# Patient Record
Sex: Female | Born: 1955 | Race: White | Hispanic: No | Marital: Married | Smoking: Never smoker
Health system: Southern US, Community
[De-identification: ages and names within clinical notes are randomized; demographics above are authoritative.]

## PROBLEM LIST (undated history)

## (undated) DIAGNOSIS — T7840XA Allergy, unspecified, initial encounter: Secondary | ICD-10-CM

## (undated) DIAGNOSIS — M810 Age-related osteoporosis without current pathological fracture: Secondary | ICD-10-CM

## (undated) DIAGNOSIS — C55 Malignant neoplasm of uterus, part unspecified: Secondary | ICD-10-CM

## (undated) HISTORY — PX: ABDOMINAL HYSTERECTOMY: SUR658

## (undated) HISTORY — DX: Age-related osteoporosis without current pathological fracture: M81.0

## (undated) HISTORY — DX: Malignant neoplasm of uterus, part unspecified: C55

## (undated) HISTORY — DX: Allergy, unspecified, initial encounter: T78.40XA

---

## 2008-08-06 DIAGNOSIS — C55 Malignant neoplasm of uterus, part unspecified: Secondary | ICD-10-CM

## 2008-08-06 HISTORY — DX: Malignant neoplasm of uterus, part unspecified: C55

## 2013-09-10 ENCOUNTER — Other Ambulatory Visit: Payer: Self-pay | Admitting: Gynecologic Oncology

## 2013-09-10 DIAGNOSIS — C541 Malignant neoplasm of endometrium: Secondary | ICD-10-CM

## 2013-09-11 ENCOUNTER — Telehealth: Payer: Self-pay | Admitting: Oncology

## 2013-09-11 NOTE — Telephone Encounter (Signed)
LEFT MESSAGE FOR PATIENT TO RETURN CALL TO SCHEDULE NEW PATIENT APPT.  °

## 2013-09-11 NOTE — Telephone Encounter (Signed)
C/D 09/11/13 for appt. 09/18/13

## 2013-09-16 ENCOUNTER — Ambulatory Visit: Payer: Self-pay | Admitting: Oncology

## 2013-09-16 ENCOUNTER — Ambulatory Visit: Payer: Self-pay

## 2013-09-16 ENCOUNTER — Other Ambulatory Visit: Payer: Self-pay

## 2013-09-17 ENCOUNTER — Other Ambulatory Visit: Payer: Self-pay | Admitting: Oncology

## 2013-09-18 ENCOUNTER — Telehealth: Payer: Self-pay | Admitting: *Deleted

## 2013-09-18 ENCOUNTER — Ambulatory Visit (HOSPITAL_BASED_OUTPATIENT_CLINIC_OR_DEPARTMENT_OTHER): Payer: BC Managed Care – PPO | Admitting: Oncology

## 2013-09-18 ENCOUNTER — Other Ambulatory Visit: Payer: Self-pay

## 2013-09-18 ENCOUNTER — Other Ambulatory Visit: Payer: BC Managed Care – PPO

## 2013-09-18 ENCOUNTER — Ambulatory Visit: Payer: Self-pay

## 2013-09-18 ENCOUNTER — Encounter: Payer: Self-pay | Admitting: Oncology

## 2013-09-18 VITALS — BP 156/89 | HR 97 | Temp 98.0°F | Resp 20 | Ht 66.0 in | Wt 142.3 lb

## 2013-09-18 DIAGNOSIS — N993 Prolapse of vaginal vault after hysterectomy: Secondary | ICD-10-CM

## 2013-09-18 DIAGNOSIS — C541 Malignant neoplasm of endometrium: Secondary | ICD-10-CM

## 2013-09-18 DIAGNOSIS — C549 Malignant neoplasm of corpus uteri, unspecified: Secondary | ICD-10-CM

## 2013-09-18 DIAGNOSIS — M810 Age-related osteoporosis without current pathological fracture: Secondary | ICD-10-CM

## 2013-09-18 DIAGNOSIS — H9191 Unspecified hearing loss, right ear: Secondary | ICD-10-CM

## 2013-09-18 DIAGNOSIS — D242 Benign neoplasm of left breast: Secondary | ICD-10-CM

## 2013-09-18 NOTE — Progress Notes (Signed)
[facility] NEW PATIENT EVALUATION   Name: Karla Watts Date: 09/18/2013 MRN: [id number] DOB: 01/17/1956  REFERRING PHYSICIAN: Deedra Ehrich CC: Verl Blalock (rheumatology, [facility]), Leda Roys (PCP, [facility]), Suzan Garibaldi (surgeon, [facility]), (J.[facility] McDonald gyn onc [facility])   REASON FOR REFERRAL: changing gyn oncology follow up location to [facility], requests continuing Faslodex in [place].   HISTORY OF PRESENT ILLNESS:Karla Watts is a delightful 58 y.o. female who is seen in consultation at the request of Dr Josephina Shih, as she wishes to continue under his care now in [place] location and is to continue treatment with Faslodex thru this office. Dr Josephina Shih also requests that she be established with high risk breast clinic at [facility]. History is from patient, some recent records from [facility] which I have reviewed and which will be scanned into EMR, and additional records now from patient. Note that her care has been in multiple locations with multiple physicians, and there may be additional pertinent information not presently available to me.   History is of abdominal pain early 2010, with CT March 2010 with 8 cm intrauterine mass consistent with fibroid, and preop endometrial biopsy and PAP normal. 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 That pathology [id number] from 12-03-2008) was reviewed at [facility] 01-31-2009, the [facility] report to be scanned into this EMR: low grade endometrial stromal sarcoma 9.5 cm, confined to uterine corpus, + LVSI, 0/19 nodes involved, benign bilateral ovaries and tubes, benign cervix; "per outside report, the tumor is diffusely and strongly positive for estrogen receptor and progesterone receptor". She had severe hot flashes after surgery/ prior to beginning aromatase inhibitor. She was seen  in consultation by Dr Carlton Adam began [place] 02-2009, which she tolerated poorly due to progressive joint aches, worse hot flashes and related insomnia; she DCd Femara in 12-2009 due to those symptoms. She tried Aromasin from 01-2010 thru 03-2010, also unable to tolerate "due to insomnia". She briefly resumed Femara at 1/2 prior dose. From 04-2012 thru 02-2013 she took raloxifene, prescribed by rheumatologist for low bone density. She has been followed with MRI scans (all at [facility]) and exams by Dr Josephina Shih now every 3 months. Patient developed symptomatic vaginal vault prolapse, with robotically assisted laparoscopic vaginal vault suspension by Dr Suzan Garibaldi, urogynecology at [facility] in [place], on 04-22-2013. At time of surgery, she had "5 mm nodules of recurrent low grade endometrial stromal sarcoma" resected. (Operative note and that path not available in present records, however path was reviewed at [facility], consistent with low grade endometrial stromal sarcoma). She was seen for second opinion consultation at [facility] by Dr Chilton Greathouse on 05-22-13 (consult note copied from patient's records now and will be scanned into EMR). The consultation note suggests that all of this incidentally found recurrence was resected at time of the urogynecology procedure; recommendation was for monthly Faslodex. She had #3 Faslodex at [facility] 09-10-2013.  Faslodex injections have been complicated by pain at injection sites, improved when these have been given higher in buttocks and with use of heating pad afterwards. She had had some fatigue after injections and some nausea without vomiting, improved with prn phenergan. She had diarrhea on day 3 after most recent injection. Hot flashes are negligible now and she is sleeping well.  The urogynecologic procedure has improved bladder symptoms significantly, tho still voids hourly and has slight urinary leakage at hs. This is  tolerable for her, and she states that Dr  Kristine Linea is aware.   Oncologic history is also remarkable for excisional biopsy of intraductal papilloma with atypia from left breast 1998 (path not available now). She has been seen at [facility] high risk breast clinic, tho I am not clear which physician. Most recent mammograms were at [facility] 01-19-2013, bilateral digital diagnostic with ultrasound, with breast tissue noted to be heterogeneously dense with no dominant mass or calcification, no significant change back to 2008, [place] with 4 mm cysts bilaterally (9:00 right and 10:00 left). I have discussed 3D/ tomo mammography with patient, as this has been useful with dense breast tissue; at our imaging facilities, the 3D is done on screening mammograms. She wonders if she should have breast MRI, tho again at our facilities the MRI needs to be within 3 months of mammograms.    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 No change self breast exam. No cardiac symptoms. GERD with lactose and ETOH, lactose intolerant. Bowels moving daily with stool softener. Weight in ~ usual range. No LE swelling, No hx blood clots. No bleeding. Arthritis pain in feet (= reason she follows with rheumatologist).   Remainder of full 10 point review of systems negative.   ALLERGIES: Ciprofloxacin; Morphine and related; Penicillins; and Sulfur Cipro - swelling, sulfa- hives, morphine - N/V, PCN - nausea  PAST MEDICAL/ SURGICAL HISTORY:    "3 or 4" miscarriages, with D&Cs  Low grade endometrial stromal sarcoma 11-2008: TAH, BSO, nodes ([place]). Recurrent <=5 mm nodules resected at repair of vaginal vault prolapse 04-2013.  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 2 other skin cancers from nose 15 years ago.  Osteoarthritis  Deafness left ear since childhood illness  (I did not find out colonoscopy status)  She had flu vaccine this season and has had zoster vaccine  CURRENT MEDICATIONS: reviewed as listed now in EMR  PHARMACY: [place], [place]   SOCIAL HISTORY: Originally from [place], lives in [place] with husband. 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 One daughter age 10, junior at [employer]. Never smoker, never transfused. Works out at gym 2-3x weekly, including biking and swimming, tho had not been very active in years past. Patient's sister is wife of Dr Osborn Coho.   FAMILY HISTORY:  Mother with breast cancer age 40, died age 47 Father died of lymphoma age 20 1 sister healthy 1 sister with high risk for breast cancer, on [place] 1 brother back surgery 1 daughter healthy           PHYSICAL EXAM:  height is 5\' 6"  (1.676 m) and weight is 142 lb 4.8 oz (64.547 kg). Her oral temperature is 98 F (36.7 C). Her blood pressure is 156/89 and her pulse is 97. Her respiration is 20.  Alert, pleasant, cooperative, excellent historian. Easily ambulatory, looks comfortable.  HEENT: PERRL, not icteric. Oral mucosa moist and clear, no obvious dental problems. Neck supple without JVD or thyromegaly.   RESPIRATORY:respirations not labored RA. Clear to auscultation and  percussion anteriorly and laterally. No use of accessory muscles  CARDIAC/ VASCULAR: RRR without murmur or gallop  ABDOMEN: soft, not tender, not distended. Normally active bowel sounds. Midline incision and laparoscopic incisions all well-healed. No appreciable HSM or mass.  LYMPH NODES: no cervical, supraclavicular, axillary or inguinal adenopathy  BREASTS: well healed  scar above left areola. Bilaterally no dominant mass, no skin or nipple findings. Axillae benign. No swelling UE.  NEUROLOGIC: other than decreased hearing right, nonfocal CN, motor, sensory, cerebellar. Psych: mood and affect appropriate.  SKIN:no rash, ecchymosis, petechiae. No concerning skin lesions noted.  MUSCULOSKELETAL: no joint abnormalities, symmetrical muscle mass. Extremities without CCE, cords, tenderness. Back not tender.    LABORATORY DATA:  Labs from [facility] 09-10-2013: WBC 8.3, ANC 4.2, Hgb 15, MCV 87, plt 280k CMET with Na 140, K 4.4, Cl 103, CO2 26, BUN 15, creat 0.65, glu 89, Ca 10, alb 4.7, Tprot 7.6, Tbili 0.4, AST 22, ALT 26, AP 100, Mg 2.2 (report to be scanned)  PATHOLOGY: [facility] review of original pathology as above (to be scanned)  RADIOGRAPHY: MRI AP w/wo contrast from [facility] 06-19-2013, compared with MRI of 10-22-12 no pericardial or pleural effusion, mild fatty infiltration left hepatic lobe with liver otherwise normal, no free fluid or adenopathy, other organs not remarkable, vertebral hemangiomas T10, L2 and L3. (report to be scanned)  Mammograms from [facility] 01-19-2013 as above (report to be scanned)    DISCUSSION: we have reviewed typical side effects with Faslodex; I am not aware of GI side effects progressively worsening with that agent, so hopefully this will not be the case. Patient has had questions about treatment of the endometrial stromal sarcoma answered as best I am able now; she understands that I have not had access to many of her records prior to visit. We have discussed having follow up MRIs done at [facility] vs in [place], and she is glad to do whichever Dr Josephina Shih prefers. She is glad to have Faslodex done at our facility and hopes to have follow up visits with Dr Josephina Shih here also. She is considering having mammograms also done in [place]; I have told her options of [place], and she may want Dr Dickie La input.  She would like to meet with Dr Humphrey Rolls in high risk breast clinic at [facility] (which will probably be best done separately from my visits rather than 2 providers in one day for billing purposes, tho I will try to confirm this). Fortunately Mrs.Vicks is very organized and savvy about her medical care, as multiple providers in multiple locations certainly is challenging. Note she does not follow closely with PCP, does see rheumatologist Dr Alford Highland on some regular basis, may have year follow up with Dr Kristine Linea (?), sees Dr Josephina Shih every 3 months.     IMPRESSION / PLAN:  1.low grade endometrial stromal sarcoma: history as above, incidentally found recurrent at urogynecologic procedure 04-2013, now on monthly Faslodex. She will have Faslodex ~ March 5, then I will see her with dose in April. She will be due to see Dr Josephina Shih with MRI in May. 2.Post repair of vaginal prolapse 04-2013: symptoms improved, tho still urinary frequency and some incontinence 3.history of intraductal papilloma left breast with atypia 1998, and sister with high risk for breast cancer: patient would like to be followed at high risk breast clinic at [facility] if this can be coordinated. 4.osteoporosis: Reclast given 08-2013, on calcium with D and supplemental D3, now doing regular exercise 5.deafness right ear  since childhood 6.hx basal cell skin ca 7.multiple miscarriages 8.osteoarthritis 9. Arthralgias from aromatase inhibitors 10. Multiple other medication allergies/ intolerances as listed 11.flu vaccine done    Patient has had questions answered as best I am able; she understands that I have not had access to many of her records prior to visit . She can contact this office for questions or concerns at any time prior to next scheduled visit. She did sign release of information if additional records needed.  Time spent  55 min, including >50% discussion and coordination of care.  No antiemetics prescribed. No chemotherapy  planned/ # of cycles not applicable. Consent for Faslodex to be obtained prior to administration at [facility].   Gordy Levan, MD 09/18/2013 4:49 PM

## 2013-09-18 NOTE — Telephone Encounter (Signed)
appts made and printed...td 

## 2013-09-18 NOTE — Patient Instructions (Signed)
Call if questions or concerns prior to next scheduled visit     [phone]

## 2013-09-20 ENCOUNTER — Encounter: Payer: Self-pay | Admitting: Oncology

## 2013-09-20 DIAGNOSIS — D242 Benign neoplasm of left breast: Secondary | ICD-10-CM | POA: Insufficient documentation

## 2013-09-20 DIAGNOSIS — C541 Malignant neoplasm of endometrium: Secondary | ICD-10-CM | POA: Insufficient documentation

## 2013-09-20 DIAGNOSIS — N993 Prolapse of vaginal vault after hysterectomy: Secondary | ICD-10-CM | POA: Insufficient documentation

## 2013-09-20 DIAGNOSIS — H9191 Unspecified hearing loss, right ear: Secondary | ICD-10-CM | POA: Insufficient documentation

## 2013-09-20 DIAGNOSIS — M810 Age-related osteoporosis without current pathological fracture: Secondary | ICD-10-CM | POA: Insufficient documentation

## 2013-09-20 NOTE — Progress Notes (Signed)
Medical Oncology  Have requested [facility] to send my note of 09-18-13 to Drs Verl Blalock, Leda Roys and Suzan Garibaldi, as these physicians not presently in routing for this EMR. Requested they be added to routing for future communication. Godfrey Pick, MD

## 2013-09-21 ENCOUNTER — Telehealth: Payer: Self-pay | Admitting: Oncology

## 2013-09-21 NOTE — Telephone Encounter (Signed)
message sent to [place] re pt being seen in high risk BRCL. all other appts requested per 2/15 pof (deferred 2/13) already on schedule. LL also copied on message.

## 2013-10-01 ENCOUNTER — Telehealth: Payer: Self-pay | Admitting: Oncology

## 2013-10-01 NOTE — Telephone Encounter (Signed)
per email response from [place] she can see pt (high risk) in 3-4 months on a tuesday in the AM. LL py - LL requesting high risk appt in [facility] lmonvm for pt re appt for 5/19 w/KK and also confirmed 3/5 and 4/1 appt. schedule mailed.

## 2013-10-07 ENCOUNTER — Other Ambulatory Visit: Payer: Self-pay | Admitting: Physician Assistant

## 2013-10-07 DIAGNOSIS — C541 Malignant neoplasm of endometrium: Secondary | ICD-10-CM

## 2013-10-08 ENCOUNTER — Ambulatory Visit (HOSPITAL_BASED_OUTPATIENT_CLINIC_OR_DEPARTMENT_OTHER): Payer: BC Managed Care – PPO

## 2013-10-08 VITALS — BP 138/88 | HR 92 | Temp 98.4°F

## 2013-10-08 DIAGNOSIS — C541 Malignant neoplasm of endometrium: Secondary | ICD-10-CM

## 2013-10-08 DIAGNOSIS — C549 Malignant neoplasm of corpus uteri, unspecified: Secondary | ICD-10-CM

## 2013-10-08 DIAGNOSIS — Z5111 Encounter for antineoplastic chemotherapy: Secondary | ICD-10-CM

## 2013-10-08 MED ORDER — FULVESTRANT 250 MG/5ML IM SOLN
500.0000 mg | Freq: Once | INTRAMUSCULAR | Status: AC
Start: 2013-10-08 — End: 2013-10-08
  Administered 2013-10-08: 500 mg via INTRAMUSCULAR
  Filled 2013-10-08: qty 10

## 2013-10-08 NOTE — Patient Instructions (Signed)
Fulvestrant injection What is this medicine? FULVESTRANT (ful VES trant) blocks the effects of estrogen. It is used to treat breast cancer in women past the age of menopause. This medicine may be used for other purposes; ask your health care provider or pharmacist if you have questions. COMMON BRAND NAME(S): FASLODEX What should I tell my health care provider before I take this medicine? They need to know if you have any of these conditions: -bleeding problems -liver disease -low levels of platelets in the blood -an unusual or allergic reaction to fulvestrant, other medicines, foods, dyes, or preservatives -pregnant or trying to get pregnant -breast-feeding How should I use this medicine? This medicine is for injection into a muscle. It is usually given by a health care professional in a hospital or clinic setting. Talk to your pediatrician regarding the use of this medicine in children. Special care may be needed. Overdosage: If you think you have taken too much of this medicine contact a poison control center or emergency room at once. NOTE: This medicine is only for you. Do not share this medicine with others. What if I miss a dose? It is important not to miss your dose. Call your doctor or health care professional if you are unable to keep an appointment. What may interact with this medicine? -medicines that treat or prevent blood clots like warfarin, enoxaparin, and dalteparin This list may not describe all possible interactions. Give your health care provider a list of all the medicines, herbs, non-prescription drugs, or dietary supplements you use. Also tell them if you smoke, drink alcohol, or use illegal drugs. Some items may interact with your medicine. What should I watch for while using this medicine? Your condition will be monitored carefully while you are receiving this medicine. You will need important blood work done while you are taking this medicine. Do not become pregnant  while taking this medicine. Women should inform their doctor if they wish to become pregnant or think they might be pregnant. There is a potential for serious side effects to an unborn child. Talk to your health care professional or pharmacist for more information. What side effects may I notice from receiving this medicine? Side effects that you should report to your doctor or health care professional as soon as possible: -allergic reactions like skin rash, itching or hives, swelling of the face, lips, or tongue -feeling faint or lightheaded, falls -fever or flu-like symptoms -sore throat -vaginal bleeding Side effects that usually do not require medical attention (report to your doctor or health care professional if they continue or are bothersome): -aches, pains -constipation or diarrhea -headache -hot flashes -nausea, vomiting -pain at site where injected -stomach pain This list may not describe all possible side effects. Call your doctor for medical advice about side effects. You may report side effects to FDA at 1-800-FDA-1088. Where should I keep my medicine? This drug is given in a hospital or clinic and will not be stored at home. NOTE: This sheet is a summary. It may not cover all possible information. If you have questions about this medicine, talk to your doctor, pharmacist, or health care provider.  2014, Elsevier/Gold Standard. (2007-12-01 15:39:24)  

## 2013-11-01 ENCOUNTER — Other Ambulatory Visit: Payer: Self-pay | Admitting: Oncology

## 2013-11-01 DIAGNOSIS — C541 Malignant neoplasm of endometrium: Secondary | ICD-10-CM

## 2013-11-04 ENCOUNTER — Ambulatory Visit (HOSPITAL_BASED_OUTPATIENT_CLINIC_OR_DEPARTMENT_OTHER): Payer: BC Managed Care – PPO | Admitting: Oncology

## 2013-11-04 ENCOUNTER — Telehealth: Payer: Self-pay | Admitting: Oncology

## 2013-11-04 ENCOUNTER — Ambulatory Visit (HOSPITAL_BASED_OUTPATIENT_CLINIC_OR_DEPARTMENT_OTHER): Payer: BC Managed Care – PPO

## 2013-11-04 ENCOUNTER — Other Ambulatory Visit (HOSPITAL_BASED_OUTPATIENT_CLINIC_OR_DEPARTMENT_OTHER): Payer: BC Managed Care – PPO

## 2013-11-04 ENCOUNTER — Encounter: Payer: Self-pay | Admitting: Oncology

## 2013-11-04 VITALS — BP 147/88 | HR 89 | Temp 98.1°F | Resp 18 | Ht 66.0 in | Wt 141.2 lb

## 2013-11-04 DIAGNOSIS — C541 Malignant neoplasm of endometrium: Secondary | ICD-10-CM

## 2013-11-04 DIAGNOSIS — C549 Malignant neoplasm of corpus uteri, unspecified: Secondary | ICD-10-CM

## 2013-11-04 DIAGNOSIS — Z5111 Encounter for antineoplastic chemotherapy: Secondary | ICD-10-CM

## 2013-11-04 DIAGNOSIS — M810 Age-related osteoporosis without current pathological fracture: Secondary | ICD-10-CM

## 2013-11-04 LAB — CBC WITH DIFFERENTIAL/PLATELET
BASO%: 0.5 % (ref 0.0–2.0)
Basophils Absolute: 0 10*3/uL (ref 0.0–0.1)
EOS%: 2.4 % (ref 0.0–7.0)
Eosinophils Absolute: 0.2 10*3/uL (ref 0.0–0.5)
HCT: 42.6 % (ref 34.8–46.6)
HGB: 14.2 g/dL (ref 11.6–15.9)
LYMPH%: 37 % (ref 14.0–49.7)
MCH: 29.2 pg (ref 25.1–34.0)
MCHC: 33.4 g/dL (ref 31.5–36.0)
MCV: 87.5 fL (ref 79.5–101.0)
MONO#: 0.6 10*3/uL (ref 0.1–0.9)
MONO%: 7 % (ref 0.0–14.0)
NEUT#: 4.7 10*3/uL (ref 1.5–6.5)
NEUT%: 53.1 % (ref 38.4–76.8)
Platelets: 271 10*3/uL (ref 145–400)
RBC: 4.87 10*6/uL (ref 3.70–5.45)
RDW: 13.1 % (ref 11.2–14.5)
WBC: 8.8 10*3/uL (ref 3.9–10.3)
lymph#: 3.3 10*3/uL (ref 0.9–3.3)

## 2013-11-04 LAB — COMPREHENSIVE METABOLIC PANEL (CC13)
ALT: 15 U/L (ref 0–55)
AST: 15 U/L (ref 5–34)
Albumin: 4.4 g/dL (ref 3.5–5.0)
Alkaline Phosphatase: 72 U/L (ref 40–150)
Anion Gap: 12 mEq/L — ABNORMAL HIGH (ref 3–11)
BUN: 17.9 mg/dL (ref 7.0–26.0)
CO2: 26 mEq/L (ref 22–29)
Calcium: 10.4 mg/dL (ref 8.4–10.4)
Chloride: 105 mEq/L (ref 98–109)
Creatinine: 0.8 mg/dL (ref 0.6–1.1)
Glucose: 122 mg/dl (ref 70–140)
Potassium: 4 mEq/L (ref 3.5–5.1)
Sodium: 143 mEq/L (ref 136–145)
Total Bilirubin: 0.47 mg/dL (ref 0.20–1.20)
Total Protein: 7.6 g/dL (ref 6.4–8.3)

## 2013-11-04 MED ORDER — FULVESTRANT 250 MG/5ML IM SOLN
500.0000 mg | INTRAMUSCULAR | Status: DC
  Administered 2013-11-04: 500 mg via INTRAMUSCULAR
  Filled 2013-11-04: qty 10

## 2013-11-04 NOTE — Telephone Encounter (Signed)
, °

## 2013-11-04 NOTE — Progress Notes (Signed)
OFFICE PROGRESS NOTE   11/04/2013   Physicians:Daniel ClarkePearson, Verl Blalock (rheumatology, [place]), Leda Roys (PCP, [facility]), Suzan Garibaldi (surgeon, [place] [place]), (J.Matthew McDonald gyn onc [facility]), [address] (GI, [place])   INTERVAL HISTORY:   Patient is seen, alone for visit, in conjunction with monthly Faslodex injection which is being used at recommendation of Dr Chilton Greathouse at [facility] for recurrent low grade endometrial stromal sarcoma. First 3 Faslodex injections were done at [facility] oncology; patient subsequently requested these be done in [place], and may follow with  Dr Josephina Shih also in [place] office. Last scan was MRI abdomen pelvix at [facility] in  Nov 2014; plan had been for repeat MRI with visit to Dr Josephina Shih in May 2014.  Patient tells me that, since I met her in Feb, she has been seen by APP for Dr Suzan Garibaldi, urogynecologist who did vaginal vault suspension procedure in 04-2013, and was on Vesicare x 10 days. The Vesicare improved voiding symptoms by day 10, but seemed to cause significant abdominal bloating, gas and constipation. She has been back off Vesicare for a week, with improvement in those symptoms, tho still more constipation than baseline and again the problems with urination. She is not comforable resuming Vesicare and agrees that she needs to discuss this with Dr Kristine Linea. She is also scheduled for 5 year follow up colonoscopy nex week, by Dr Cherie Ouch in [place].  Patient is concerned about the bladder symptoms and abdominal symptoms. We need to clarify if Dr Josephina Shih wants repeat scans and visit at [facility] (where she has had prior MRIs and where she may be able to be seen sooner than in [place], as she also has some time in May that she prefers not to have appointments).  She tolerated Faslodex on 10-08-2013 with less local discomfort than previously, still some fatigue, apparently not  significant N/V or diarrhea this time. She feels more emotional when she is due for injection.  ONCOLOGIC HISTORY History is of abdominal pain early 2010, with CT March 2010 with 8 cm intrauterine mass consistent with fibroid, and preop endometrial biopsy and PAP normal. She had TAH BSO and staging 12-03-2008 by gyn oncology at [facility] in [place], with finding of stage IB low grade endometrial stromal sarcoma. That pathology ([id number] from 12-03-2008) was reviewed at [facility] 01-31-2009, the [facility] report to be scanned into this EMR: low grade endometrial stromal sarcoma 9.5 cm, confined to uterine corpus, + LVSI, 0/19 nodes involved, benign bilateral ovaries and tubes, benign cervix; "per outside report, the tumor is diffusely and strongly positive for estrogen receptor and progesterone receptor".  She had severe hot flashes after surgery/ prior to beginning aromatase inhibitor. She was seen in consultation by Dr Carlton Adam began Zena 02-2009, which she tolerated poorly due to progressive joint aches, worse hot flashes and related insomnia; she DCd Femara in 12-2009 due to those symptoms. She tried Aromasin from 01-2010 thru 03-2010, also unable to tolerate "due to insomnia". She briefly resumed Femara at 1/2 prior dose. From 04-2012 thru 02-2013 she took raloxifene, prescribed by rheumatologist for low bone density. She has been followed with MRI scans (all at [facility]) and exams by Dr Josephina Shih now every 3 months.  Patient developed symptomatic vaginal vault prolapse, with robotically assisted laparoscopic vaginal vault suspension by Dr Suzan Garibaldi, urogynecology at [facility] in [place], on 04-22-2013. At time of surgery, she had "5 mm nodules of recurrent low grade endometrial stromal sarcoma" resected. (Operative note and  that path not available in present records, however path was reviewed at [facility], consistent with low grade endometrial stromal sarcoma). She was seen for second  opinion consultation at [facility] by Dr Chilton Greathouse on 05-22-13 (consult note copied from patient's records now and will be scanned into EMR). The consultation note suggests that all of this incidentally found recurrence was resected at time of the urogynecology procedure; recommendation was for monthly Faslodex. She had #3 Faslodex at [facility] 09-10-2013 and injection at [facility] in [place] 10-08-2013.   Review of systems as above, also: No fever or clear symptoms of infection. No bleeding. No LE swelling. No other pain. Remainder of 10 point Review of Systems negative.  Objective:  Vital signs in last 24 hours:  BP 147/88  Pulse 89  Temp(Src) 98.1 F (36.7 C) (Oral)  Resp 18  Ht 5' 6"  (1.676 m)  Wt 141 lb 3.2 oz (64.048 kg)  BMI 22.80 kg/m2 Weight is down 1 lb. Alert, oriented and not in obvious discomfort. Easily mobile and ambulatory without difficulty.  Normal hair pattern.  HEENT:PERRL, sclerae not icteric. Oral mucosa moist without lesions, posterior pharynx clear.  Neck supple. No JVD.  Lymphatics:no cervical,suraclavicular, axillary or inguinal adenopathy Resp: clear to auscultation bilaterally and normal percussion bilaterally Cardio: regular rate and rhythm. No gallop. GI: soft, nontender, not obviously distended, no appreciable mass or organomegaly. Some bowel sounds thruout. Surgical incision not remarkable. Musculoskeletal/ Extremities: without pitting edema, cords, tenderness Neuro: no peripheral neuropathy. Otherwise nonfocal. PSYCH: somewhat anxious Skin without rash, ecchymosis, petechiae   Lab Results:  Results for orders placed in visit on 11/04/13  CBC WITH DIFFERENTIAL      Result Value Ref Range   WBC 8.8  3.9 - 10.3 10e3/uL   NEUT# 4.7  1.5 - 6.5 10e3/uL   HGB 14.2  11.6 - 15.9 g/dL   HCT 42.6  34.8 - 46.6 %   Platelets 271  145 - 400 10e3/uL   MCV 87.5  79.5 - 101.0 fL   MCH 29.2  25.1 - 34.0 pg   MCHC 33.4  31.5 - 36.0 g/dL   RBC 4.87  3.70 - 5.45 10e6/uL    RDW 13.1  11.2 - 14.5 %   lymph# 3.3  0.9 - 3.3 10e3/uL   MONO# 0.6  0.1 - 0.9 10e3/uL   Eosinophils Absolute 0.2  0.0 - 0.5 10e3/uL   Basophils Absolute 0.0  0.0 - 0.1 10e3/uL   NEUT% 53.1  38.4 - 76.8 %   LYMPH% 37.0  14.0 - 49.7 %   MONO% 7.0  0.0 - 14.0 %   EOS% 2.4  0.0 - 7.0 %   BASO% 0.5  0.0 - 2.0 %  COMPREHENSIVE METABOLIC PANEL (DZ32)      Result Value Ref Range   Sodium 143  136 - 145 mEq/L   Potassium 4.0  3.5 - 5.1 mEq/L   Chloride 105  98 - 109 mEq/L   CO2 26  22 - 29 mEq/L   Glucose 122  70 - 140 mg/dl   BUN 17.9  7.0 - 26.0 mg/dL   Creatinine 0.8  0.6 - 1.1 mg/dL   Total Bilirubin 0.47  0.20 - 1.20 mg/dL   Alkaline Phosphatase 72  40 - 150 U/L   AST 15  5 - 34 U/L   ALT 15  0 - 55 U/L   Total Protein 7.6  6.4 - 8.3 g/dL   Albumin 4.4  3.5 - 5.0 g/dL  Calcium 10.4  8.4 - 10.4 mg/dL   Anion Gap 12 (*) 3 - 11 mEq/L     Studies/Results:  No results found.  Medications: I have reviewed the patient's current medications. She is now on mirabegron by urogynecologist She did receive #5 Faslodex today  DISCUSSION: Patient has numerous questions today concerning choice of present Faslodex (recommended by Dr Chilton Greathouse at [facility] after she was unable to tolerate aromatase inhibitors per information that was available to me when I met her last month). She tells me that she spoke back to Dr Dia Crawford office by phone with questions after her visit there, and is aware that she can call that office again or certainly could be seen again by Dr Mauro Kaufmann requested. She has concerns about fact that she was on raloxifene previously (per my information that was by rheumatologist, tho history is complicated and that may not be correct). I have explained mechanism of action of tamoxifen (and raloxifene) vs aromatase inhibitors to her, but have also told her that Dr Mauro Kaufmann has expertise in this problem and certainly it would be best to discuss her concerns about treatment with him.  I do think  the colonoscopy next week is appropriate and have encouraged her to make an appointment with Dr Kristine Linea. I have discussed appoinments with gyn oncology staff in [place], with next available to Dr Josephina Shih here May 20 (given her other time constraints also). I do NOT know if he prefers MRI at [facility] where priors are available, vs [place], and will try to clarify this. Patient feels now that she may want visit and scans at [facility] before May, which seems very reasonable given the voiding and abdominal symptoms recently.  I tried to encourage her to consider continuity of care, as her situation is complicated and it is obviously difficult for multiple physicians in several locations to coordinate her care and follow up.     Assessment/Plan:  1.low grade endometrial stromal sarcoma: history as above, incidentally found recurrent at urogynecologic procedure 04-2013, now on monthly Faslodex since ~ Nov 2014 per recommendation from [facility]. #5 Faslodex given today. Plan was for repeat MRI and reevaluation by Dr Josephina Shih in May, tho some new symptoms so may be reasonable to do this sooner. Will let Dr Josephina Shih know situation to direct scheduling. If she continues Faslodex, this will be due April 29 and May 27. 2.Post repair of vaginal prolapse 04-2013: symptoms improved, but recent increased urinary frequency and some incontinence. Trial of vesicare by urogynecologist in [place] as above 3.history of intraductal papilloma left breast with atypia 1998, and sister with high risk for breast cancer: patient would like to be followed at high risk breast clinic at [facility], set up to see Dr Marcy Panning also in May. 4.osteoporosis: Reclast given 08-2013, on calcium with D and supplemental D3, now doing regular exercise  5.deafness right ear since childhood  6.hx basal cell skin ca  7.multiple miscarriages  8.osteoarthritis  9. Arthralgias from aromatase inhibitors  10. Multiple other medication allergies/  intolerances as listed       Kathren Scearce P, MD   11/04/2013, 3:21 PM

## 2013-11-04 NOTE — Patient Instructions (Signed)
Suggest seeing urology MD about recent bladder symptoms.  Colonoscopy next week as scheduled is appropriate.  Certainly fine to see Dr Josephina Shih at [facility] earlier than the May 20 apt in [place], and could do scans there.

## 2013-11-09 ENCOUNTER — Other Ambulatory Visit: Payer: Self-pay | Admitting: Gynecologic Oncology

## 2013-11-09 ENCOUNTER — Telehealth: Payer: Self-pay | Admitting: *Deleted

## 2013-11-09 DIAGNOSIS — C541 Malignant neoplasm of endometrium: Secondary | ICD-10-CM

## 2013-11-09 NOTE — Progress Notes (Signed)
Patient to be scheduled for MR of the abdomen and pelvis with and without contrast per Dr. Fermin Schwab for recurrent low grade endometrial stromal sarcoma.

## 2013-11-09 NOTE — Telephone Encounter (Signed)
A call was placed [facility] scheduling  to schedule pt's MRI. Karla Watts from [place] scheduling stated that she had called the patient earlier and scheduled the MRI @ [facility] on May 18th.

## 2013-11-10 ENCOUNTER — Telehealth: Payer: Self-pay

## 2013-11-10 NOTE — Telephone Encounter (Signed)
Message copied by Baruch Merl on Tue Nov 10, 2013 11:32 AM ------      Message from: Gordy Levan      Created: Mon Nov 09, 2013  5:23 PM       Labs seen and need follow up: please let her know chemistries from visit last week were fine. Please tell her that I think it would be best to get follow up MRI and to see Dr Josephina Shih before May; if she has not already made these arrangements herself, I will ask gyn onc staff to get things set up. ------

## 2013-11-10 NOTE — Telephone Encounter (Signed)
The lab information as noted below by Dr. Marko Plume was left in patient's voice mail.  Joylene John NP. Has ordered the MRI to be scheduled ~12-21-13.

## 2013-12-01 ENCOUNTER — Ambulatory Visit (HOSPITAL_BASED_OUTPATIENT_CLINIC_OR_DEPARTMENT_OTHER): Payer: BC Managed Care – PPO

## 2013-12-01 VITALS — BP 148/86 | HR 70 | Temp 98.4°F

## 2013-12-01 DIAGNOSIS — C549 Malignant neoplasm of corpus uteri, unspecified: Secondary | ICD-10-CM

## 2013-12-01 DIAGNOSIS — C541 Malignant neoplasm of endometrium: Secondary | ICD-10-CM

## 2013-12-01 DIAGNOSIS — Z5111 Encounter for antineoplastic chemotherapy: Secondary | ICD-10-CM

## 2013-12-01 MED ORDER — FULVESTRANT 250 MG/5ML IM SOLN
500.0000 mg | INTRAMUSCULAR | Status: DC
  Administered 2013-12-01: 500 mg via INTRAMUSCULAR
  Filled 2013-12-01: qty 10

## 2013-12-08 ENCOUNTER — Telehealth: Payer: Self-pay

## 2013-12-08 NOTE — Telephone Encounter (Signed)
Reviewed  Karla Watts' rescheduled appointments for Faslodex injections to keep them 4 weeks apart and coordinate appointments with MD visits as well. Stated that Dr. Humphrey Rolls is currently out on unexpected  FMLA and will not be here to see her on 12-22-13 as currently scheduled. Told her that Breast clinic secretary [name] is rescheduling the high risk patient with Dr. Jana Hakim in August.   Karla Watts stated that she was to see MD for high risk in Feburary and now it will be August.  Can she be referred to someone in [place] or  [place] or some one earlier at [facility]. Told Karla Watts that her appointment concerns will be discussed with Dr. Marko Plume for recommendations at this time.

## 2013-12-15 ENCOUNTER — Other Ambulatory Visit: Payer: Self-pay | Admitting: Oncology

## 2013-12-15 DIAGNOSIS — Z1231 Encounter for screening mammogram for malignant neoplasm of breast: Secondary | ICD-10-CM

## 2013-12-21 ENCOUNTER — Other Ambulatory Visit: Payer: Self-pay | Admitting: Gynecologic Oncology

## 2013-12-21 ENCOUNTER — Ambulatory Visit (HOSPITAL_COMMUNITY)
Admission: RE | Admit: 2013-12-21 | Discharge: 2013-12-21 | Disposition: A | Discharge status: Home / Self Care | Payer: BC Managed Care – PPO | Source: Ambulatory Visit | Attending: Gynecologic Oncology | Admitting: Gynecologic Oncology

## 2013-12-21 DIAGNOSIS — C541 Malignant neoplasm of endometrium: Secondary | ICD-10-CM

## 2013-12-21 DIAGNOSIS — N8111 Cystocele, midline: Secondary | ICD-10-CM | POA: Insufficient documentation

## 2013-12-21 DIAGNOSIS — Z90710 Acquired absence of both cervix and uterus: Secondary | ICD-10-CM | POA: Insufficient documentation

## 2013-12-21 DIAGNOSIS — Z8542 Personal history of malignant neoplasm of other parts of uterus: Secondary | ICD-10-CM | POA: Insufficient documentation

## 2013-12-21 MED ORDER — GADOBENATE DIMEGLUMINE 529 MG/ML IV SOLN
15.0000 mL | Freq: Once | INTRAVENOUS | Status: AC | PRN
  Administered 2013-12-21: 13 mL via INTRAVENOUS

## 2013-12-22 ENCOUNTER — Ambulatory Visit: Payer: BC Managed Care – PPO | Admitting: Oncology

## 2013-12-22 ENCOUNTER — Telehealth: Payer: Self-pay | Admitting: *Deleted

## 2013-12-22 ENCOUNTER — Ambulatory Visit: Payer: BC Managed Care – PPO

## 2013-12-22 NOTE — Telephone Encounter (Signed)
Message copied by Lucile Crater on Tue Dec 22, 2013 10:12 AM ------      Message from: Karla Watts      Created: Tue Dec 22, 2013  8:40 AM       Think she sees Dr CP soon but her MRI looks unremarkable.       ----- Message -----         From: Rad Results In Interface         Sent: 12/21/2013  11:25 AM           To: Dorothyann Gibbs, NP                   ------

## 2013-12-22 NOTE — Telephone Encounter (Signed)
Called pt confirmed appt tomorrow 5/20 with Dr. Fermin Schwab. Results given

## 2013-12-23 ENCOUNTER — Encounter: Payer: Self-pay | Admitting: Gynecology

## 2013-12-23 ENCOUNTER — Ambulatory Visit: Payer: BC Managed Care – PPO | Attending: Gynecology | Admitting: Gynecology

## 2013-12-23 VITALS — BP 142/83 | HR 90 | Temp 97.9°F | Resp 20 | Ht 64.96 in | Wt 144.0 lb

## 2013-12-23 DIAGNOSIS — Z90710 Acquired absence of both cervix and uterus: Secondary | ICD-10-CM | POA: Insufficient documentation

## 2013-12-23 DIAGNOSIS — C549 Malignant neoplasm of corpus uteri, unspecified: Secondary | ICD-10-CM | POA: Insufficient documentation

## 2013-12-23 DIAGNOSIS — C541 Malignant neoplasm of endometrium: Secondary | ICD-10-CM

## 2013-12-23 DIAGNOSIS — Z79899 Other long term (current) drug therapy: Secondary | ICD-10-CM | POA: Insufficient documentation

## 2013-12-23 NOTE — Progress Notes (Signed)
Consult Note: Gyn-Onc   Karla Watts 58 y.o. female  No chief complaint on file.   Assessment : Recurrent low-grade endometrial stromal sarcoma. Clinically free of disease.  Plan: We will continue Faslodex 500 mg IM every 4 weeks. She returned to see me in 6 months and we will obtain an MRI shortly prior to that visit.  Interval History: The patient returns today for continuing followup of low-grade endometrial stromal sarcoma. A recurrence was discovered in September 2014. Subsequently the patient's been on Faslodex 500 mg IM every 4 weeks. On 12/21/2013 she had an MRI of the abdomen and pelvis which is entirely negative. Patient is self sees be doing very well. She denies any GI symptoms. Functional status is excellent. She does complain of some urinary urgency and some swelling in her thighs. Patient's recently seen a urologist to [facility] recommendations regarding changing her diet.  HPI: Patient developed abdominal pain and an 8 cm intrauterine mass in March of 2010. This is thought to be a fibroid. Preoperative endometrial biopsy and Pap smear were normal. She underwent a total abdominal hysterectomy bilateral salpingo-oophorectomy and staging procedures by a gynecologic oncologist. Final pathology showed a low-grade endometrial stromal sarcoma. She received a short course of Femara but did not tolerate it well therefore discontinued therapy. She was then followed until September 2014 with a recurrence was found in the pelvic peritoneum at the time of robotic assisted vaginal vault suspension. The patient sought second opinion consultation at [facility] with the sarcoma service. Multiple options were recommended for adjuvant treatment and subsequently the patient chose be treated with Faslodex.  Review of Systems:10 point review of systems is negative except as noted in interval history.   Vitals: Blood pressure 142/83, pulse 90, temperature 97.9 F (36.6 C),  temperature source Oral, resp. rate 20, height 5' 4.96" (1.65 m), weight 144 lb (65.318 kg).  Physical Exam: General : The patient is a healthy woman in no acute distress.  HEENT: normocephalic, extraoccular movements normal; neck is supple without thyromegally  Lynphnodes: Supraclavicular and inguinal nodes not enlarged  Abdomen: Soft, non-tender, no ascites, no organomegally, no masses, no hernias  Pelvic:  EGBUS: Normal female  Vagina: Normal, no lesions , she does have a moderate cystocele. Urethra and Bladder: Normal, non-tender  Cervix: Surgically absent  Uterus: Surgically absent  Bi-manual examination: Non-tender; no adenxal masses or nodularity  Rectal: normal sphincter tone, no masses, no blood  Lower extremities: No edema or varicosities. Normal range of motion      Allergies  Allergen Reactions  . Ciprofloxacin Swelling  . Morphine And Related Nausea And Vomiting  . Penicillins Nausea Only  . Sulfur Hives    Past Medical History  Diagnosis Date  . Uterine cancer   . Osteoporosis   . Allergy     History reviewed. No pertinent past surgical history.  Current Outpatient Prescriptions  Medication Sig Dispense Refill  . Calcium-Vitamin D-Vitamin K (VIACTIV) [phone] MG-UNT-MCG CHEW Chew 1 tablet by mouth daily.      . cetirizine (ZYRTEC) 10 MG tablet Take 10 mg by mouth daily as needed for allergies.      . Cholecalciferol (VITAMIN D3) 1000 UNITS CAPS Take 1 capsule by mouth daily.      . diclofenac (VOLTAREN) 75 MG EC tablet Take 75 mg by mouth 2 (two) times daily.      Marland Kitchen docusate sodium (COLACE) 100 MG capsule Take 100 mg by mouth 2 (two) times daily.      Marland Kitchen  fluticasone (FLONASE) 50 MCG/ACT nasal spray Place into both nostrils daily as needed for allergies or rhinitis.      . fulvestrant (FASLODEX) 250 MG/5ML injection Inject 500 mg into the muscle every 30 (thirty) days. One injection each buttock over 1-2 minutes. Warm prior to use.      . Mirabegron  (MYRBETRIQ PO) Take by mouth.      . Multiple Vitamin (MULTIVITAMIN) capsule Take 1 capsule by mouth daily.       No current facility-administered medications for this visit.    History   Social History  . Marital Status: Married    Spouse Name: N/A    Number of Children: N/A  . Years of Education: N/A   Occupational History  . Not on file.   Social History Main Topics  . Smoking status: Never Smoker   . Smokeless tobacco: Not on file  . Alcohol Use: No  . Drug Use: No  . Sexual Activity: Not on file   Other Topics Concern  . Not on file   Social History Narrative  . No narrative on file    Family History  Problem Relation Age of Onset  . Cancer Mother   . Cancer Father       Alvino Chapel, MD 12/23/2013, 12:02 PM

## 2013-12-23 NOTE — Patient Instructions (Signed)
Return to see [place] in 6 months. We'll schedule an MRI prior to that visit.

## 2013-12-29 ENCOUNTER — Ambulatory Visit (HOSPITAL_BASED_OUTPATIENT_CLINIC_OR_DEPARTMENT_OTHER): Payer: BC Managed Care – PPO

## 2013-12-29 VITALS — BP 142/99 | HR 87 | Temp 98.2°F

## 2013-12-29 DIAGNOSIS — C541 Malignant neoplasm of endometrium: Secondary | ICD-10-CM

## 2013-12-29 DIAGNOSIS — Z5189 Encounter for other specified aftercare: Secondary | ICD-10-CM

## 2013-12-29 DIAGNOSIS — C549 Malignant neoplasm of corpus uteri, unspecified: Secondary | ICD-10-CM

## 2013-12-29 MED ORDER — FULVESTRANT 250 MG/5ML IM SOLN
500.0000 mg | INTRAMUSCULAR | Status: DC
Start: 2013-12-29 — End: 2013-12-29
  Administered 2013-12-29: 500 mg via INTRAMUSCULAR
  Filled 2013-12-29: qty 10

## 2013-12-29 NOTE — Patient Instructions (Signed)
Fulvestrant injection What is this medicine? FULVESTRANT (ful VES trant) blocks the effects of estrogen. It is used to treat breast cancer in women past the age of menopause. This medicine may be used for other purposes; ask your health care provider or pharmacist if you have questions. COMMON BRAND NAME(S): FASLODEX What should I tell my health care provider before I take this medicine? They need to know if you have any of these conditions: -bleeding problems -liver disease -low levels of platelets in the blood -an unusual or allergic reaction to fulvestrant, other medicines, foods, dyes, or preservatives -pregnant or trying to get pregnant -breast-feeding How should I use this medicine? This medicine is for injection into a muscle. It is usually given by a health care professional in a hospital or clinic setting. Talk to your pediatrician regarding the use of this medicine in children. Special care may be needed. Overdosage: If you think you have taken too much of this medicine contact a poison control center or emergency room at once. NOTE: This medicine is only for you. Do not share this medicine with others. What if I miss a dose? It is important not to miss your dose. Call your doctor or health care professional if you are unable to keep an appointment. What may interact with this medicine? -medicines that treat or prevent blood clots like warfarin, enoxaparin, and dalteparin This list may not describe all possible interactions. Give your health care provider a list of all the medicines, herbs, non-prescription drugs, or dietary supplements you use. Also tell them if you smoke, drink alcohol, or use illegal drugs. Some items may interact with your medicine. What should I watch for while using this medicine? Your condition will be monitored carefully while you are receiving this medicine. You will need important blood work done while you are taking this medicine. Do not become pregnant  while taking this medicine. Women should inform their doctor if they wish to become pregnant or think they might be pregnant. There is a potential for serious side effects to an unborn child. Talk to your health care professional or pharmacist for more information. What side effects may I notice from receiving this medicine? Side effects that you should report to your doctor or health care professional as soon as possible: -allergic reactions like skin rash, itching or hives, swelling of the face, lips, or tongue -feeling faint or lightheaded, falls -fever or flu-like symptoms -sore throat -vaginal bleeding Side effects that usually do not require medical attention (report to your doctor or health care professional if they continue or are bothersome): -aches, pains -constipation or diarrhea -headache -hot flashes -nausea, vomiting -pain at site where injected -stomach pain This list may not describe all possible side effects. Call your doctor for medical advice about side effects. You may report side effects to FDA at 1-800-FDA-1088. Where should I keep my medicine? This drug is given in a hospital or clinic and will not be stored at home. NOTE: This sheet is a summary. It may not cover all possible information. If you have questions about this medicine, talk to your doctor, pharmacist, or health care provider.  2014, Elsevier/Gold Standard. (2007-12-01 15:39:24)  

## 2014-01-18 ENCOUNTER — Ambulatory Visit: Payer: BC Managed Care – PPO

## 2014-01-18 ENCOUNTER — Other Ambulatory Visit: Payer: BC Managed Care – PPO

## 2014-01-18 ENCOUNTER — Ambulatory Visit: Payer: BC Managed Care – PPO | Admitting: Oncology

## 2014-01-24 ENCOUNTER — Other Ambulatory Visit: Payer: Self-pay | Admitting: Oncology

## 2014-01-24 DIAGNOSIS — C541 Malignant neoplasm of endometrium: Secondary | ICD-10-CM

## 2014-01-25 ENCOUNTER — Telehealth: Payer: Self-pay | Admitting: Oncology

## 2014-01-25 NOTE — Telephone Encounter (Signed)
per pof to add inj for pt-cld & spoke to pt & adv we had added inj to 6/24 appt @10 -pt understood

## 2014-01-27 ENCOUNTER — Other Ambulatory Visit (HOSPITAL_BASED_OUTPATIENT_CLINIC_OR_DEPARTMENT_OTHER): Payer: BC Managed Care – PPO

## 2014-01-27 ENCOUNTER — Ambulatory Visit
Admission: RE | Admit: 2014-01-27 | Discharge: 2014-01-27 | Disposition: A | Discharge status: Home / Self Care | Payer: BC Managed Care – PPO | Source: Ambulatory Visit | Attending: Oncology | Admitting: Oncology

## 2014-01-27 ENCOUNTER — Telehealth: Payer: Self-pay | Admitting: Oncology

## 2014-01-27 ENCOUNTER — Ambulatory Visit (HOSPITAL_BASED_OUTPATIENT_CLINIC_OR_DEPARTMENT_OTHER): Payer: BC Managed Care – PPO

## 2014-01-27 ENCOUNTER — Ambulatory Visit (HOSPITAL_BASED_OUTPATIENT_CLINIC_OR_DEPARTMENT_OTHER): Payer: BC Managed Care – PPO | Admitting: Oncology

## 2014-01-27 ENCOUNTER — Encounter: Payer: Self-pay | Admitting: Oncology

## 2014-01-27 VITALS — BP 150/88 | HR 74 | Temp 97.7°F | Resp 18 | Ht 64.96 in | Wt 141.6 lb

## 2014-01-27 DIAGNOSIS — C549 Malignant neoplasm of corpus uteri, unspecified: Secondary | ICD-10-CM

## 2014-01-27 DIAGNOSIS — Z1231 Encounter for screening mammogram for malignant neoplasm of breast: Secondary | ICD-10-CM

## 2014-01-27 DIAGNOSIS — D249 Benign neoplasm of unspecified breast: Secondary | ICD-10-CM

## 2014-01-27 DIAGNOSIS — M1990 Unspecified osteoarthritis, unspecified site: Secondary | ICD-10-CM

## 2014-01-27 DIAGNOSIS — C541 Malignant neoplasm of endometrium: Secondary | ICD-10-CM

## 2014-01-27 DIAGNOSIS — IMO0001 Reserved for inherently not codable concepts without codable children: Secondary | ICD-10-CM

## 2014-01-27 DIAGNOSIS — Z5111 Encounter for antineoplastic chemotherapy: Secondary | ICD-10-CM

## 2014-01-27 DIAGNOSIS — M810 Age-related osteoporosis without current pathological fracture: Secondary | ICD-10-CM

## 2014-01-27 LAB — CBC WITH DIFFERENTIAL/PLATELET
BASO%: 0.6 % (ref 0.0–2.0)
Basophils Absolute: 0.1 10*3/uL (ref 0.0–0.1)
EOS%: 1.9 % (ref 0.0–7.0)
Eosinophils Absolute: 0.2 10*3/uL (ref 0.0–0.5)
HCT: 41.7 % (ref 34.8–46.6)
HGB: 13.7 g/dL (ref 11.6–15.9)
LYMPH%: 36.7 % (ref 14.0–49.7)
MCH: 29.3 pg (ref 25.1–34.0)
MCHC: 32.8 g/dL (ref 31.5–36.0)
MCV: 89.3 fL (ref 79.5–101.0)
MONO#: 0.8 10*3/uL (ref 0.1–0.9)
MONO%: 8.8 % (ref 0.0–14.0)
NEUT#: 4.9 10*3/uL (ref 1.5–6.5)
NEUT%: 52 % (ref 38.4–76.8)
Platelets: 279 10*3/uL (ref 145–400)
RBC: 4.67 10*6/uL (ref 3.70–5.45)
RDW: 13.4 % (ref 11.2–14.5)
WBC: 9.5 10*3/uL (ref 3.9–10.3)
lymph#: 3.5 10*3/uL — ABNORMAL HIGH (ref 0.9–3.3)

## 2014-01-27 LAB — COMPREHENSIVE METABOLIC PANEL (CC13)
ALT: 19 U/L (ref 0–55)
AST: 14 U/L (ref 5–34)
Albumin: 4.3 g/dL (ref 3.5–5.0)
Alkaline Phosphatase: 75 U/L (ref 40–150)
Anion Gap: 10 mEq/L (ref 3–11)
BUN: 15 mg/dL (ref 7.0–26.0)
CO2: 27 mEq/L (ref 22–29)
Calcium: 10.4 mg/dL (ref 8.4–10.4)
Chloride: 106 mEq/L (ref 98–109)
Creatinine: 0.7 mg/dL (ref 0.6–1.1)
Glucose: 81 mg/dl (ref 70–140)
Potassium: 4.1 mEq/L (ref 3.5–5.1)
Sodium: 143 mEq/L (ref 136–145)
Total Bilirubin: 0.44 mg/dL (ref 0.20–1.20)
Total Protein: 7.6 g/dL (ref 6.4–8.3)

## 2014-01-27 MED ORDER — FULVESTRANT 250 MG/5ML IM SOLN
500.0000 mg | INTRAMUSCULAR | Status: DC
  Administered 2014-01-27: 500 mg via INTRAMUSCULAR
  Filled 2014-01-27: qty 10

## 2014-01-27 NOTE — Progress Notes (Signed)
OFFICE PROGRESS NOTE   01/27/2014   Physicians:Daniel ClarkePearson, Verl Blalock (rheumatology, [place]), Leda Roys (PCP, [facility]), Suzan Garibaldi (surgeon, [facility] [place]), (J.Matthew McDonald gyn onc [facility]), [address] (GI, [place]), _ [facility], [facility])    INTERVAL HISTORY:  Patient is seen, alone for visit, as she continues monthly Faslodex injections for recurrent low grade endometrial stromal sarcoma, this at the recommendation of Dr Chilton Greathouse at [facility]. She is tolerating the Faslodex well now, including without significant discomfort at the injection site. She had MRI AP 12-21-13, without evidence of disease identified, and saw Dr Josephina Shih shortly after that scan. He plans follow up with another MRI in 6 months; patient may also schedule follow up with Dr Mauro Kaufmann after the next scan.  Since I saw her last, Mrs. Mcgonagle has had cystoscopy at [facility], reportedly with nothing of concern, and has remained off Vesicare, which she was not tolerating well. She had colonoscopy by Dr Janora Norlander, which was planned 5 year follow up and reportedly was fine. She still has some constipation despite fiber and colace such that she has to use prn miralax or dulcolax, which work well. I have recommended that she use either the miralax or dulcolax daily.  She is for mammograms at [facility] today, which have been requested as 3D/tomo. Unfortunately high risk clinic at this office is not staffed presently, as she had been interested in evaluation there.She understands that she can certainly choose to consult at a high risk clinic at another facility.  She does not have central catheter.   ONCOLOGIC HISTORY History is of abdominal pain early 2010, with CT March 2010 with 8 cm intrauterine mass consistent with fibroid, and preop endometrial biopsy and PAP normal. She had TAH BSO and staging 12-03-2008 by gyn oncology at 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 That pathology ([id number] from 12-03-2008) was reviewed at [facility] 01-31-2009, the [facility] report to be scanned into this EMR: low grade endometrial stromal sarcoma 9.5 cm, confined to uterine corpus, + LVSI, 0/19 nodes involved, benign bilateral ovaries and tubes, benign cervix; "per outside report, the tumor is diffusely and strongly positive for estrogen receptor and progesterone receptor".  She had severe hot flashes after surgery/ prior to beginning aromatase inhibitor. She was seen in consultation by Dr Carlton Adam began [place] 02-2009, which she tolerated poorly due to progressive joint aches, worse hot flashes and related insomnia; she DCd Femara in 12-2009 due to those symptoms. She tried Aromasin from 01-2010 thru 03-2010, also unable to tolerate "due to insomnia". She briefly resumed Femara at 1/2 prior dose. From 04-2012 thru 02-2013 she took raloxifene, prescribed by rheumatologist for low bone density. She has been followed with MRI scans (all at [facility]) and exams by Dr Josephina Shih now every 3 months.  Patient developed symptomatic vaginal vault prolapse, with robotically assisted laparoscopic vaginal vault suspension by Dr Suzan Garibaldi, urogynecology at [facility] in [place], on 04-22-2013. At time of surgery, she had "5 mm nodules of recurrent low grade endometrial stromal sarcoma" resected. (Operative note and that path not available in present records, however path was reviewed at [facility], consistent with low grade endometrial stromal sarcoma). She was seen for second opinion consultation at [facility] by Dr Chilton Greathouse on 05-22-13 (consult note copied from patient's records now and will be scanned into EMR). The consultation note suggests that all of this incidentally found recurrence was resected at time of the  urogynecology procedure; recommendation was for monthly Faslodex. She had #3 Faslodex at [facility]  09-10-2013 and injection at [facility] in [place] 10-08-2013.   Review of systems as above, also: Energy and appetite generally good. No fever or symptoms of infection. No new or different pain. No noted changes in breasts. No SOB or other respiratory symptoms. No bleeding. No LE swelling Remainder of 10 point Review of Systems negative.  Objective:  Vital signs in last 24 hours:  BP 150/88  Pulse 74  Temp(Src) 97.7 F (36.5 C) (Oral)  Resp 18  Ht 5' 4.96" (1.65 m)  Wt 141 lb 9.6 oz (64.229 kg)  BMI 23.59 kg/m2  SpO2 100%  Alert, oriented and appropriate. Ambulatory without difficulty  HEENT:PERRL, sclerae not icteric. Oral mucosa moist without lesions, posterior pharynx clear.  Neck supple. No JVD.  Lymphatics:no cervical,suraclavicular, axillary or inguinal adenopathy Resp: clear to auscultation bilaterally and normal percussion bilaterally Cardio: regular rate and rhythm. No gallop. GI: soft, nontender, not distended, no mass or organomegaly. Normally active bowel sounds. Surgical incision not remarkable. Musculoskeletal/ Extremities: without pitting edema, cords, tenderness Neuro: no peripheral neuropathy. Otherwise nonfocal Skin without rash, ecchymosis, petechiae  Lab Results:  Results for orders placed in visit on 01/27/14  CBC WITH DIFFERENTIAL      Result Value Ref Range   WBC 9.5  3.9 - 10.3 10e3/uL   NEUT# 4.9  1.5 - 6.5 10e3/uL   HGB 13.7  11.6 - 15.9 g/dL   HCT 41.7  34.8 - 46.6 %   Platelets 279  145 - 400 10e3/uL   MCV 89.3  79.5 - 101.0 fL   MCH 29.3  25.1 - 34.0 pg   MCHC 32.8  31.5 - 36.0 g/dL   RBC 4.67  3.70 - 5.45 10e6/uL   RDW 13.4  11.2 - 14.5 %   lymph# 3.5 (*) 0.9 - 3.3 10e3/uL   MONO# 0.8  0.1 - 0.9 10e3/uL   Eosinophils Absolute 0.2  0.0 - 0.5 10e3/uL   Basophils Absolute 0.1  0.0 - 0.1 10e3/uL   NEUT% 52.0  38.4 - 76.8 %   LYMPH% 36.7  14.0 - 49.7 %   MONO% 8.8  0.0 - 14.0 %   EOS% 1.9  0.0 - 7.0 %   BASO% 0.6  0.0 - 2.0 %  COMPREHENSIVE  METABOLIC PANEL (VO35)      Result Value Ref Range   Sodium 143  136 - 145 mEq/L   Potassium 4.1  3.5 - 5.1 mEq/L   Chloride 106  98 - 109 mEq/L   CO2 27  22 - 29 mEq/L   Glucose 81  70 - 140 mg/dl   BUN 15.0  7.0 - 26.0 mg/dL   Creatinine 0.7  0.6 - 1.1 mg/dL   Total Bilirubin 0.44  0.20 - 1.20 mg/dL   Alkaline Phosphatase 75  40 - 150 U/L   AST 14  5 - 34 U/L   ALT 19  0 - 55 U/L   Total Protein 7.6  6.4 - 8.3 g/dL   Albumin 4.3  3.5 - 5.0 g/dL   Calcium 10.4  8.4 - 10.4 mg/dL   Anion Gap 10  3 - 11 mEq/L     Studies/Results: MRI ABDOMEN AND PELVIS WITHOUT AND WITH CONTRAST  12-21-13  COMPARISON: None.  FINDINGS:  MRI ABDOMEN FINDINGS  The liver is unremarkable. No focal hepatic lesions or intrahepatic  biliary dilatation. The gallbladder is normal. No common bowel duct  dilatation.  The pancreas is normal. The spleen is normal. The  adrenal glands and kidneys are normal.  The stomach, duodenum, small bowel and colon are unremarkable. No  mesenteric or retroperitoneal mass or adenopathy. The aorta and  branch vessels are normal. The major venous structures are patent.  No mesenteric or peritoneal implants are identified.  MRI PELVIS FINDINGS  The uterus is surgically absent. I cannot identify the ovaries. No  pelvic mass or lymphadenopathy. A cystocele is noted. Otherwise, the  bladder is normal. The rectum and sigmoid colon are unremarkable.  The visualized small bowel loops appear normal. No inguinal mass or  adenopathy. The bony pelvis is intact. No areas of abnormal  enhancement are identified.  IMPRESSION:  Unremarkable MR examination of the abdomen/ pelvis. No findings for  recurrent tumor, adenopathy or metastatic disease.    Medications: I have reviewed the patient's current medications. Recommended daily laxative as above. Continue monthly Faslodex  Assessment/Plan: 1.low grade endometrial stromal sarcoma: history as above, incidentally found recurrent at  urogynecologic procedure 04-2013, now on monthly Faslodex since ~ Nov 2014 per recommendation from [facility]. Repeat MRI and reevaluation by Dr Josephina Shih in May without evidence of disease. Will continue Faslodex until next MRI in 6 months. 2.Post repair of vaginal prolapse 04-2013: symptoms improved and recent cystoscopy reportedly fine.  3.history of intraductal papilloma left breast with atypia 1998, and sister with high risk for breast cancer: may want to schedule with high risk clinic at [facility] or other for consultation. We have discussed regular exercise, keeping weight at ideal and healthy diet as general recommendations for high risk patients. 4.osteoporosis: Reclast given 08-2013, on calcium with D and supplemental D3, now doing regular exercise  5.deafness right ear since childhood  6.hx basal cell skin ca  7.multiple miscarriages  8.osteoarthritis  9. Arthralgias from aromatase inhibitors  10. Multiple other medication allergies/ intolerances as listed    Patient is comfortable with plan and has had all questions answered to her satisfaction.    LIVESAY,LENNIS P, MD   01/27/2014, 9:38 AM

## 2014-01-27 NOTE — Telephone Encounter (Signed)
per pof to sch pt inj & appts-gave pt copy of sch

## 2014-02-24 ENCOUNTER — Other Ambulatory Visit: Payer: Self-pay | Admitting: Oncology

## 2014-02-24 ENCOUNTER — Ambulatory Visit (HOSPITAL_BASED_OUTPATIENT_CLINIC_OR_DEPARTMENT_OTHER): Payer: BC Managed Care – PPO

## 2014-02-24 VITALS — BP 142/88 | HR 87 | Temp 98.2°F

## 2014-02-24 DIAGNOSIS — C549 Malignant neoplasm of corpus uteri, unspecified: Secondary | ICD-10-CM

## 2014-02-24 DIAGNOSIS — Z5111 Encounter for antineoplastic chemotherapy: Secondary | ICD-10-CM

## 2014-02-24 DIAGNOSIS — C541 Malignant neoplasm of endometrium: Secondary | ICD-10-CM

## 2014-02-24 MED ORDER — FULVESTRANT 250 MG/5ML IM SOLN
500.0000 mg | INTRAMUSCULAR | Status: DC
  Administered 2014-02-24: 500 mg via INTRAMUSCULAR
  Filled 2014-02-24: qty 10

## 2014-03-24 ENCOUNTER — Ambulatory Visit (HOSPITAL_BASED_OUTPATIENT_CLINIC_OR_DEPARTMENT_OTHER): Payer: BC Managed Care – PPO

## 2014-03-24 VITALS — BP 134/81 | HR 79 | Temp 98.1°F

## 2014-03-24 DIAGNOSIS — C549 Malignant neoplasm of corpus uteri, unspecified: Secondary | ICD-10-CM

## 2014-03-24 DIAGNOSIS — C541 Malignant neoplasm of endometrium: Secondary | ICD-10-CM

## 2014-03-24 DIAGNOSIS — Z5111 Encounter for antineoplastic chemotherapy: Secondary | ICD-10-CM

## 2014-03-24 MED ORDER — FULVESTRANT 250 MG/5ML IM SOLN
500.0000 mg | INTRAMUSCULAR | Status: DC
  Administered 2014-03-24: 500 mg via INTRAMUSCULAR
  Filled 2014-03-24: qty 10

## 2014-04-01 ENCOUNTER — Telehealth: Payer: Self-pay | Admitting: *Deleted

## 2014-04-01 NOTE — Telephone Encounter (Signed)
Patient called asking if "side effects of Faslodex worsen over time".  Has received injections for five months.  Feels really "tired, dizzy with hot flashes".  Denies fever but has "headache all over and pain behind right ear where doesn't hear from permanent nerve damage".  "Possible sinus infection because I have a slight cough and haven't taken any anti-histamines."  After reviewing symptoms and possibilities [name] will go to an urgent care because lives several hours away.  Next F/U will be 04-21-2014 with Dr. Marko Plume

## 2014-04-18 ENCOUNTER — Other Ambulatory Visit: Payer: Self-pay | Admitting: Oncology

## 2014-04-18 DIAGNOSIS — C541 Malignant neoplasm of endometrium: Secondary | ICD-10-CM

## 2014-04-21 ENCOUNTER — Encounter: Payer: Self-pay | Admitting: Oncology

## 2014-04-21 ENCOUNTER — Ambulatory Visit (HOSPITAL_BASED_OUTPATIENT_CLINIC_OR_DEPARTMENT_OTHER): Payer: BC Managed Care – PPO

## 2014-04-21 ENCOUNTER — Ambulatory Visit (HOSPITAL_BASED_OUTPATIENT_CLINIC_OR_DEPARTMENT_OTHER): Payer: BC Managed Care – PPO | Admitting: Oncology

## 2014-04-21 ENCOUNTER — Other Ambulatory Visit (HOSPITAL_BASED_OUTPATIENT_CLINIC_OR_DEPARTMENT_OTHER): Payer: BC Managed Care – PPO

## 2014-04-21 ENCOUNTER — Telehealth: Payer: Self-pay | Admitting: Oncology

## 2014-04-21 VITALS — BP 162/83 | HR 78 | Temp 97.8°F | Resp 18 | Ht 64.75 in | Wt 143.6 lb

## 2014-04-21 DIAGNOSIS — C549 Malignant neoplasm of corpus uteri, unspecified: Secondary | ICD-10-CM

## 2014-04-21 DIAGNOSIS — C541 Malignant neoplasm of endometrium: Secondary | ICD-10-CM

## 2014-04-21 DIAGNOSIS — M810 Age-related osteoporosis without current pathological fracture: Secondary | ICD-10-CM

## 2014-04-21 DIAGNOSIS — Z5111 Encounter for antineoplastic chemotherapy: Secondary | ICD-10-CM

## 2014-04-21 LAB — COMPREHENSIVE METABOLIC PANEL (CC13)
ALT: 20 U/L (ref 0–55)
AST: 18 U/L (ref 5–34)
Albumin: 4.3 g/dL (ref 3.5–5.0)
Alkaline Phosphatase: 93 U/L (ref 40–150)
Anion Gap: 11 mEq/L (ref 3–11)
BUN: 18 mg/dL (ref 7.0–26.0)
CO2: 28 mEq/L (ref 22–29)
Calcium: 10.4 mg/dL (ref 8.4–10.4)
Chloride: 103 mEq/L (ref 98–109)
Creatinine: 0.7 mg/dL (ref 0.6–1.1)
Glucose: 84 mg/dl (ref 70–140)
Potassium: 4.2 mEq/L (ref 3.5–5.1)
Sodium: 142 mEq/L (ref 136–145)
Total Bilirubin: 0.54 mg/dL (ref 0.20–1.20)
Total Protein: 7.6 g/dL (ref 6.4–8.3)

## 2014-04-21 LAB — CBC WITH DIFFERENTIAL/PLATELET
BASO%: 0.6 % (ref 0.0–2.0)
Basophils Absolute: 0.1 10*3/uL (ref 0.0–0.1)
EOS%: 2.6 % (ref 0.0–7.0)
Eosinophils Absolute: 0.2 10*3/uL (ref 0.0–0.5)
HCT: 41.9 % (ref 34.8–46.6)
HGB: 13.8 g/dL (ref 11.6–15.9)
LYMPH%: 32.4 % (ref 14.0–49.7)
MCH: 29.4 pg (ref 25.1–34.0)
MCHC: 33 g/dL (ref 31.5–36.0)
MCV: 89 fL (ref 79.5–101.0)
MONO#: 0.9 10*3/uL (ref 0.1–0.9)
MONO%: 9.4 % (ref 0.0–14.0)
NEUT#: 5.2 10*3/uL (ref 1.5–6.5)
NEUT%: 55 % (ref 38.4–76.8)
Platelets: 305 10*3/uL (ref 145–400)
RBC: 4.71 10*6/uL (ref 3.70–5.45)
RDW: 13 % (ref 11.2–14.5)
WBC: 9.4 10*3/uL (ref 3.9–10.3)
lymph#: 3 10*3/uL (ref 0.9–3.3)

## 2014-04-21 MED ORDER — FULVESTRANT 250 MG/5ML IM SOLN
500.0000 mg | INTRAMUSCULAR | Status: DC
  Administered 2014-04-21: 500 mg via INTRAMUSCULAR
  Filled 2014-04-21: qty 10

## 2014-04-21 NOTE — Telephone Encounter (Signed)
, °

## 2014-04-21 NOTE — Progress Notes (Signed)
OFFICE PROGRESS NOTE   04/21/2014   Physicians:Daniel ClarkePearson, Knox Saliva ([facility]), Verl Blalock (rheumatology, [place]), Leda Roys (PCP, [facility]), Suzan Garibaldi (surgeon, [facility] [place]), (J.Matthew McDonald gyn onc [facility]), [address] (GI, [place]), _ [facility], Baldo Ash)    INTERVAL HISTORY:  Patient is seen, alone for visit, continuing monthly Faslodex injections for recurrent low grade endometrial stromal sarcoma. She has been on the Faslodex since ~ early Dec 2014, at recommendation of Dr Knox Saliva at [facility]. She had MRI abdomen pelvis shortly prior to seeing Dr Josephina Shih in 12-2013 and will have MRI again before she sees him in Nov.   Patient is tolerating the Faslodex injections generally well, very pleased with injection technique now and less local discomfort last month after limiting exercise for a few days after the injection. Indigestion has resolved and GI symptoms improved with increased laxatives as needed to keep bowels moving daily (has used additional dulcolax tablets ~ 5 times in past month). She and husband have just returned from trip to [place], which she enjoyed other than needed to void more frequently than this was convenient. She is on macrobid for UTI found after return to [place].  She does not have central catheter. She prefers to get flu shot a bit later in season at her pharmacy.   ONCOLOGIC HISTORY History is of abdominal pain early 2010, with CT March 2010 with 8 cm intrauterine mass consistent with fibroid, and preop endometrial biopsy and PAP normal. 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 That pathology ([id number] from 12-03-2008) was reviewed at [facility] 01-31-2009, the [facility] report to be scanned into this EMR: low grade endometrial stromal sarcoma 9.5 cm, confined to  uterine corpus, + LVSI, 0/19 nodes involved, benign bilateral ovaries and tubes, benign cervix; "per outside report, the tumor is diffusely and strongly positive for estrogen receptor and progesterone receptor".  She had severe hot flashes after surgery/ prior to beginning aromatase inhibitor. She was seen in consultation by Dr Carlton Adam began [place] 02-2009, which she tolerated poorly due to progressive joint aches, worse hot flashes and related insomnia; she DCd Femara in 12-2009 due to those symptoms. She tried Aromasin from 01-2010 thru 03-2010, also unable to tolerate "due to insomnia". She briefly resumed Femara at 1/2 prior dose. From 04-2012 thru 02-2013 she took raloxifene, prescribed by rheumatologist for low bone density. She has been followed with MRI scans (all at [facility]) and exams by Dr Josephina Shih now every 3 months.  Patient developed symptomatic vaginal vault prolapse, with robotically assisted laparoscopic vaginal vault suspension by Dr Suzan Garibaldi, urogynecology at [facility] in [place], on 04-22-2013. At time of surgery, she had "5 mm nodules of recurrent low grade endometrial stromal sarcoma" resected. (Operative note and that path not available in present records, however path was reviewed at [facility], consistent with low grade endometrial stromal sarcoma). She was seen for second opinion consultation at [facility] by Dr Chilton Greathouse on 05-22-13 (consult note copied from patient's records now and will be scanned into EMR). The consultation note suggests that all of this incidentally found recurrence was resected at time of the urogynecology procedure; recommendation was for monthly Faslodex. She had #3 Faslodex at [facility] 09-10-2013 and has continued monthly injections at [facility] since 10-08-2013. MRI AP 12-21-13 had no evidence of disease.  Review of systems as above, also: No fever. Good appetite,  good energy. No SOB or other respiratory symptoms. No LE swelling. No abdominal or pelvic pain or  other concerns. No bleeding. Remainder of 10 point Review of Systems negative.  Objective:  Vital signs in last 24 hours:  BP 162/83  Pulse 78  Temp(Src) 97.8 F (36.6 C) (Oral)  Resp 18  Ht 5' 4.75" (1.645 m)  Wt 143 lb 9.6 oz (65.137 kg)  BMI 24.07 kg/m2 Weight is up 2 lbs. Alert, oriented and appropriate. Ambulatory without difficulty.  HEENT:PERRL, sclerae not icteric. Oral mucosa moist without lesions, posterior pharynx clear.  Neck supple. No JVD.  Lymphatics:no cervical,suraclavicular or inguinal adenopathy Resp: clear to auscultation bilaterally and normal percussion bilaterally Cardio: regular rate and rhythm. No gallop. GI: soft, nontender, not distended, no mass or organomegaly. Normally active bowel sounds. Surgical incision not remarkable. Musculoskeletal/ Extremities: without pitting edema, cords, tenderness Neuro: nonfocal. PSYCH appropriate mood and affect Skin without rash, ecchymosis, petechiae   Lab Results:  Results for orders placed in visit on 04/21/14  CBC WITH DIFFERENTIAL      Result Value Ref Range   WBC 9.4  3.9 - 10.3 10e3/uL   NEUT# 5.2  1.5 - 6.5 10e3/uL   HGB 13.8  11.6 - 15.9 g/dL   HCT 41.9  34.8 - 46.6 %   Platelets 305  145 - 400 10e3/uL   MCV 89.0  79.5 - 101.0 fL   MCH 29.4  25.1 - 34.0 pg   MCHC 33.0  31.5 - 36.0 g/dL   RBC 4.71  3.70 - 5.45 10e6/uL   RDW 13.0  11.2 - 14.5 %   lymph# 3.0  0.9 - 3.3 10e3/uL   MONO# 0.9  0.1 - 0.9 10e3/uL   Eosinophils Absolute 0.2  0.0 - 0.5 10e3/uL   Basophils Absolute 0.1  0.0 - 0.1 10e3/uL   NEUT% 55.0  38.4 - 76.8 %   LYMPH% 32.4  14.0 - 49.7 %   MONO% 9.4  0.0 - 14.0 %   EOS% 2.6  0.0 - 7.0 %   BASO% 0.6  0.0 - 2.0 %  COMPREHENSIVE METABOLIC PANEL (IO97)      Result Value Ref Range   Sodium 142  136 - 145 mEq/L   Potassium 4.2  3.5 - 5.1 mEq/L   Chloride 103  98 - 109 mEq/L   CO2 28  22 - 29 mEq/L   Glucose 84  70 - 140 mg/dl   BUN 18.0  7.0 - 26.0 mg/dL   Creatinine 0.7  0.6 - 1.1  mg/dL   Total Bilirubin 0.54  0.20 - 1.20 mg/dL   Alkaline Phosphatase 93  40 - 150 U/L   AST 18  5 - 34 U/L   ALT 20  0 - 55 U/L   Total Protein 7.6  6.4 - 8.3 g/dL   Albumin 4.3  3.5 - 5.0 g/dL   Calcium 10.4  8.4 - 10.4 mg/dL   Anion Gap 11  3 - 11 mEq/L     Studies/Results:  No results found.  Medications: I have reviewed the patient's current medications. She will have flu vaccine outside. No change to Faslodex now.  DISCUSSION: she wonders about timing for a follow up visit at [facility], which I have suggested probably best within a couple of months after upcoming MRI, unless she wants to delay until subsequent MRI likely spring 2016.  Assessment/Plan: 1.low grade endometrial stromal sarcoma: history as above, incidentally found recurrent at urogynecologic procedure 04-2013, now on  monthly Faslodex since ~ Nov 2014 per recommendation from [facility]. Repeat MRI and reevaluation by Dr Josephina Shih [phone] without evidence of disease. Will continue Faslodex, next MRI and follow up with gyn onc in Nov. 2.Post repair of vaginal prolapse 04-2013: symptoms improved and recent cystoscopy reportedly fine.  3.history of intraductal papilloma left breast with atypia 1998, and sister with high risk for breast cancer: may want to schedule with high risk clinic at [facility] or other for consultation. Regular exercise, keep weight at ideal and healthy diet as general recommendations for high risk patients.  4.osteoporosis: Reclast given 08-2013, on calcium with D and supplemental D3, now doing regular exercise  5.deafness right ear since childhood  6.hx basal cell skin ca  7.multiple miscarriages  8.osteoarthritis  9. Arthralgias from aromatase inhibitors, resolved 10. Multiple other medication allergies/ intolerances as listed 11.UTI symptoms improving on antibiotic now   Faslodex orders entered. I will see her with injection in ~ 4 months or sooner if needed.    Nazirah Tri P, MD   04/21/2014, 10:57  AM

## 2014-04-27 ENCOUNTER — Telehealth: Payer: Self-pay

## 2014-04-27 NOTE — Telephone Encounter (Signed)
Message copied by Baruch Merl on Tue Apr 27, 2014 11:49 AM ------      Message from: Gordy Levan      Created: Tue Apr 27, 2014 11:04 AM       Labs seen and need follow up: please send her copies of these labs ------

## 2014-04-27 NOTE — Telephone Encounter (Signed)
Mailed labs from 04-21-14 to patient's home address listed in EMR as requested below by Dr. Marko Plume.

## 2014-05-20 ENCOUNTER — Encounter: Payer: Self-pay | Admitting: Genetic Counselor

## 2014-05-20 ENCOUNTER — Other Ambulatory Visit: Payer: BC Managed Care – PPO

## 2014-05-20 ENCOUNTER — Telehealth: Payer: Self-pay | Admitting: Oncology

## 2014-05-20 ENCOUNTER — Ambulatory Visit (HOSPITAL_BASED_OUTPATIENT_CLINIC_OR_DEPARTMENT_OTHER): Payer: BC Managed Care – PPO

## 2014-05-20 ENCOUNTER — Ambulatory Visit (HOSPITAL_BASED_OUTPATIENT_CLINIC_OR_DEPARTMENT_OTHER): Payer: BC Managed Care – PPO | Admitting: Genetic Counselor

## 2014-05-20 DIAGNOSIS — C55 Malignant neoplasm of uterus, part unspecified: Secondary | ICD-10-CM | POA: Insufficient documentation

## 2014-05-20 DIAGNOSIS — C541 Malignant neoplasm of endometrium: Secondary | ICD-10-CM

## 2014-05-20 DIAGNOSIS — Z315 Encounter for genetic counseling: Secondary | ICD-10-CM

## 2014-05-20 DIAGNOSIS — Z803 Family history of malignant neoplasm of breast: Secondary | ICD-10-CM

## 2014-05-20 DIAGNOSIS — Z5111 Encounter for antineoplastic chemotherapy: Secondary | ICD-10-CM

## 2014-05-20 MED ORDER — FULVESTRANT 250 MG/5ML IM SOLN
500.0000 mg | Freq: Once | INTRAMUSCULAR | Status: AC
  Administered 2014-05-20: 500 mg via INTRAMUSCULAR
  Filled 2014-05-20: qty 10

## 2014-05-20 NOTE — Telephone Encounter (Signed)
, °

## 2014-05-20 NOTE — Progress Notes (Addendum)
 Dr.  Lennis Livesay requested a consultation for genetic counseling and risk assessment for Karla Watts, a 58 y.o. female, for discussion of her personal history of endometrial stromal sarcoma and family history of lobular breast cancer.  She presents to clinic today to discuss the possibility of a genetic predisposition to cancer, and to further clarify her risks, as well as her family members' risks for cancer.   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  She had a TAH/BSO at that time and chemotherapy.  She is currently on Faslodex.  At age 43, Karla Watts was diagnosed with a papilloma with atypia.  She has had three colonoscopy's, and has a total of 3 polyps.  Pathology unknown.     Past Medical History  Diagnosis Date  . Osteoporosis   . Allergy   . Uterine cancer 2010    Endometrial Stromal Sarcoma    History reviewed. No pertinent past surgical history.  History   Social History  . Marital Status: Married    Spouse Name: N/A    Number of Children: N/A  . Years of Education: N/A   Social History Main Topics  . Smoking status: Never Smoker   . Smokeless tobacco: None  . Alcohol Use: Yes     Comment: 1-2 per week  . Drug Use: No  . Sexual Activity: None   Other Topics Concern  . None   Social History Narrative  . None    REPRODUCTIVE HISTORY AND PERSONAL RISK ASSESSMENT FACTORS: Menarche was at age 14.   postmenopausal Uterus Intact: no Ovaries Intact: no G4P1A3, first live birth at age 37  She has previously undergone treatment for infertility.   Oral Contraceptive use: 10 years   She has used HRT in the past.    FAMILY HISTORY:  We obtained a detailed, 4-generation family history.  Significant diagnoses are listed below: Family History  Problem Relation Age of Onset  . Breast cancer Mother 52    lobular breast cancer  . Lymphoma Father 69  . Breast cancer Sister 64    lobular  breast cancer  . Stroke Maternal Grandfather   . Leukemia Paternal Grandmother     dx in her 70s  . Heart attack Paternal Grandfather     Patient's maternal ancestors are of Irish descent, and paternal ancestors are of [place] descent. There is no reported Ashkenazi Jewish ancestry. There is no known consanguinity.  GENETIC COUNSELING ASSESSMENT: [name] is a 58 y.o. female with a personal history of endometrial stromal sarcoma and family history of lobular breast cancer which somewhat suggestive of a possible hereditary cancer syndrome and predisposition to cancer. We, therefore, discussed and recommended the following at today's visit.   DISCUSSION: We reviewed the characteristics, features and inheritance patterns of hereditary cancer syndromes. We also discussed genetic testing, including the appropriate family members to test, the process of testing, insurance coverage and turn-around-time for results. We reviewed that typically stromal sarcoma's are not associated with hereditary cancer syndromes.  Most cancer syndromes involving breast cancer pose an increased risk for ductal breast cancer, although a cancer syndrome cannot be ruled out based on this.  One gene associated more frequently with lobular breast cancer is CDH1.  This gene is also associated with a crossover cancer of diffuse gastric cancer, of which is not reported in Karla Watts' family.    [name] is concerned whether her   treatment would change based on the genetic test results.  Her sister is reportedly going through genetic testing at this time, but her results are not back.  We discussed waiting for her results, and if she tested positive, then do targeted testing based on that result.  We also discussed that if the sister was negative, whether Karla Watts' would still want to pursue genetic testing.  She stated that she wanted to undergo genetic testing, regardless of her sister's results.  In order to estimate her chance  of having a BRCA mutation, we used statistical models (Tyrer Cusik, [place] and Myriad risk calculator) and laboratory data that take into account her personal medical history, family history and ancestry.  Because each model is different, there can be a lot of variability in the risks they give.  Therefore, these numbers must be considered a rough range and not a precise risk of having a BRCA mutation.  These models estimate that she has approximately a <1-4% chance of having a mutation.  Based on the patient's personal and family history, statistical models (Tyrer Cusik and [employer])  and literature data were used to estimate her risk of developing breast cancer. These estimate her lifetime risk of developing breast cancer to be approximately 36% to 42%. This estimation does not take into account any genetic testing results.  The patient's lifetime breast cancer risk is a preliminary estimate based on available information using one of several models endorsed by the [place] (ACS). The ACS recommends consideration of breast MRI screening as an adjunct to mammography for patients at high risk (defined as 20% or greater lifetime risk). A more detailed breast cancer risk assessment can be considered, if clinically indicated.   In the past, Karla Watts has been seen in a high risk breast clinic at [facility].  She would like to be seen in a high risk clinic here at [facility].  I explained that Dr. Lindi Adie sees women who are at high risk for breast cancer.  I had contacted Dr. Lindi Adie and he stated that Karla Watts schedules patients for him in that clinic.  I will pass Mrs. Blasingame' name on to [facility] for her to call and schedule her into the high risk clinic.  PLAN: After considering the risks, benefits, and limitations, [name] provided informed consent to pursue genetic testing and the blood sample will be sent to [facility] for analysis of the [place]. We discussed the  implications of a positive, negative and/ or variant of uncertain significance genetic test result. Results should be available within approximately 2-4 weeks' time, at which point they will be disclosed by telephone to Sarita Haver, as will any additional recommendations warranted by these results. Sarita Haver will receive a summary of her genetic counseling visit and a copy of her results once available. This information will also be available in Epic. We encouraged ALEISA HOWK to remain in contact with cancer genetics annually so that we can continuously update the family history and inform her of any changes in cancer genetics and testing that may be of benefit for her family. Kinnley Paulson Massett's questions were answered to her satisfaction today. Our contact information was provided should additional questions or concerns arise.  The patient was seen for a total of 45 minutes, greater than 50% of which was spent face-to-face counseling.  This note will also be sent to the referring provider via the electronic medical record. The patient will  be supplied with a summary of this genetic counseling discussion as well as educational information on the discussed hereditary cancer syndromes following the conclusion of their visit.    _______________________________________________________________________ For Office Staff:  Number of people involved in session: 1 Was an Intern/ student involved with case: no

## 2014-05-20 NOTE — Patient Instructions (Signed)
Fulvestrant injection  What is this medicine?  FULVESTRANT (ful VES trant) blocks the effects of estrogen. It is used to treat breast cancer in women past the age of menopause.  This medicine may be used for other purposes; ask your health care provider or pharmacist if you have questions.  COMMON BRAND NAME(S): FASLODEX  What should I tell my health care provider before I take this medicine?  They need to know if you have any of these conditions:  -bleeding problems  -liver disease  -low levels of platelets in the blood  -an unusual or allergic reaction to fulvestrant, other medicines, foods, dyes, or preservatives  -pregnant or trying to get pregnant  -breast-feeding  How should I use this medicine?  This medicine is for injection into a muscle. It is usually given by a health care professional in a hospital or clinic setting.  Talk to your pediatrician regarding the use of this medicine in children. Special care may be needed.  Overdosage: If you think you have taken too much of this medicine contact a poison control center or emergency room at once.  NOTE: This medicine is only for you. Do not share this medicine with others.  What if I miss a dose?  It is important not to miss your dose. Call your doctor or health care professional if you are unable to keep an appointment.  What may interact with this medicine?  -medicines that treat or prevent blood clots like warfarin, enoxaparin, and dalteparin  This list may not describe all possible interactions. Give your health care provider a list of all the medicines, herbs, non-prescription drugs, or dietary supplements you use. Also tell them if you smoke, drink alcohol, or use illegal drugs. Some items may interact with your medicine.  What should I watch for while using this medicine?  Your condition will be monitored carefully while you are receiving this medicine. You will need important blood work done while you are taking this medicine.  Do not become pregnant  while taking this medicine. Women should inform their doctor if they wish to become pregnant or think they might be pregnant. There is a potential for serious side effects to an unborn child. Talk to your health care professional or pharmacist for more information.  What side effects may I notice from receiving this medicine?  Side effects that you should report to your doctor or health care professional as soon as possible:  -allergic reactions like skin rash, itching or hives, swelling of the face, lips, or tongue  -feeling faint or lightheaded, falls  -fever or flu-like symptoms  -sore throat  -vaginal bleeding  Side effects that usually do not require medical attention (report to your doctor or health care professional if they continue or are bothersome):  -aches, pains  -constipation or diarrhea  -headache  -hot flashes  -nausea, vomiting  -pain at site where injected  -stomach pain  This list may not describe all possible side effects. Call your doctor for medical advice about side effects. You may report side effects to FDA at 1-800-FDA-1088.  Where should I keep my medicine?  This drug is given in a hospital or clinic and will not be stored at home.  NOTE: This sheet is a summary. It may not cover all possible information. If you have questions about this medicine, talk to your doctor, pharmacist, or health care provider.   2015, Elsevier/Gold Standard. (2007-12-01 15:39:24)

## 2014-05-22 ENCOUNTER — Telehealth: Payer: Self-pay | Admitting: Oncology

## 2014-06-01 ENCOUNTER — Telehealth: Payer: Self-pay | Admitting: *Deleted

## 2014-06-01 NOTE — Telephone Encounter (Signed)
Called pt lmovm appt for Nov 13 needs to be r/s for different date. Requested pt call office to confirm new appt date/time.

## 2014-06-08 ENCOUNTER — Other Ambulatory Visit: Payer: Self-pay | Admitting: Gynecology

## 2014-06-08 ENCOUNTER — Other Ambulatory Visit: Payer: Self-pay | Admitting: Gynecologic Oncology

## 2014-06-08 DIAGNOSIS — R52 Pain, unspecified: Secondary | ICD-10-CM

## 2014-06-08 DIAGNOSIS — C541 Malignant neoplasm of endometrium: Secondary | ICD-10-CM

## 2014-06-09 ENCOUNTER — Ambulatory Visit (HOSPITAL_COMMUNITY): Admission: RE | Admit: 2014-06-09 | Payer: BC Managed Care – PPO | Source: Ambulatory Visit

## 2014-06-09 ENCOUNTER — Ambulatory Visit (HOSPITAL_COMMUNITY)
Admission: RE | Admit: 2014-06-09 | Discharge: 2014-06-09 | Disposition: A | Discharge status: Home / Self Care | Payer: BC Managed Care – PPO | Source: Ambulatory Visit | Attending: Gynecology | Admitting: Gynecology

## 2014-06-09 DIAGNOSIS — R52 Pain, unspecified: Secondary | ICD-10-CM | POA: Diagnosis not present

## 2014-06-09 DIAGNOSIS — C541 Malignant neoplasm of endometrium: Secondary | ICD-10-CM

## 2014-06-09 DIAGNOSIS — D390 Neoplasm of uncertain behavior of uterus: Secondary | ICD-10-CM | POA: Diagnosis present

## 2014-06-09 MED ORDER — GADOBENATE DIMEGLUMINE 529 MG/ML IV SOLN
15.0000 mL | Freq: Once | INTRAVENOUS | Status: AC
  Administered 2014-06-09: 13 mL via INTRAVENOUS

## 2014-06-11 ENCOUNTER — Encounter: Payer: Self-pay | Admitting: Gynecology

## 2014-06-11 ENCOUNTER — Other Ambulatory Visit: Payer: Self-pay | Admitting: Oncology

## 2014-06-11 ENCOUNTER — Ambulatory Visit: Payer: BC Managed Care – PPO | Attending: Gynecology | Admitting: Gynecology

## 2014-06-11 VITALS — BP 138/80 | HR 98 | Temp 98.3°F | Resp 20 | Wt 146.4 lb

## 2014-06-11 DIAGNOSIS — Z90710 Acquired absence of both cervix and uterus: Secondary | ICD-10-CM | POA: Diagnosis not present

## 2014-06-11 DIAGNOSIS — C541 Malignant neoplasm of endometrium: Secondary | ICD-10-CM

## 2014-06-11 DIAGNOSIS — R32 Unspecified urinary incontinence: Secondary | ICD-10-CM | POA: Diagnosis not present

## 2014-06-11 DIAGNOSIS — Z90722 Acquired absence of ovaries, bilateral: Secondary | ICD-10-CM | POA: Insufficient documentation

## 2014-06-11 DIAGNOSIS — M810 Age-related osteoporosis without current pathological fracture: Secondary | ICD-10-CM | POA: Diagnosis not present

## 2014-06-11 DIAGNOSIS — D390 Neoplasm of uncertain behavior of uterus: Secondary | ICD-10-CM

## 2014-06-11 DIAGNOSIS — Z79899 Other long term (current) drug therapy: Secondary | ICD-10-CM | POA: Diagnosis not present

## 2014-06-11 DIAGNOSIS — Z780 Asymptomatic menopausal state: Secondary | ICD-10-CM

## 2014-06-11 DIAGNOSIS — IMO0002 Reserved for concepts with insufficient information to code with codable children: Secondary | ICD-10-CM

## 2014-06-11 DIAGNOSIS — Z9079 Acquired absence of other genital organ(s): Secondary | ICD-10-CM | POA: Diagnosis not present

## 2014-06-11 DIAGNOSIS — N8110 Cystocele, unspecified: Secondary | ICD-10-CM | POA: Diagnosis not present

## 2014-06-11 NOTE — Progress Notes (Signed)
Consult Note: Gyn-Onc   Karla Watts 58 y.o. female  Chief Complaint  Patient presents with  . 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 Clinically free of disease.cystocele which is relatively asymptomatic.  Plan: We will continue Faslodex 500 mg IM every 4 weeks. She returned to see me in 6 months and we will obtain an MRI shortly prior to that visit.in the interval she will be seen at [facility].  The patient is reassured regarding the findings of a cystocele and unless symptoms worsen or do not recommend any surgical intervention.  Interval History: The patient returns today for continuing followup of low-grade endometrial stromal sarcoma. A recurrence was discovered in September 2014. Subsequently the patient's been on Faslodex 500 mg IM every 4 weeks. On November 4,2015 she had an MRI of the abdomen and pelvis which is entirely negative. Patient is self sees be doing very well. She denies any GI symptoms. Functional status is excellent. She has some symptoms of protrusion from her cystocele and minimal urinary incontinence.  Her sisters recently been diagnosed with lobular breast cancer and the patient has a number questions regarding best imaging for her own breasts. She has recently undergone genetic counseling and testing results are not back yet.  She has a number of family issues and feels that she is not sleeping well.  HPI: Patient developed abdominal pain and an 8 cm intrauterine mass in March of 2010. This is thought to be a fibroid. Preoperative endometrial biopsy and Pap smear were normal. She underwent a total abdominal hysterectomy bilateral salpingo-oophorectomy and staging procedures by a gynecologic oncologist. Final pathology showed a low-grade endometrial stromal sarcoma. She received a short course of Femara but did not tolerate it well therefore discontinued therapy. She was  then followed until September 2014 with a recurrence was found in the pelvic peritoneum at the time of robotic assisted vaginal vault suspension. The patient sought second opinion consultation at [facility] with the sarcoma service. Multiple options were recommended for adjuvant treatment and subsequently the patient chose be treated with Faslodex.  Review of Systems:10 point review of systems is negative except as noted in interval history.   Vitals: Blood pressure 138/80, pulse 98, temperature 98.3 F (36.8 C), temperature source Oral, resp. rate 20, weight 146 lb 6.4 oz (66.407 kg).  Physical Exam: General : The patient is a healthy woman in no acute distress.  HEENT: normocephalic, extraoccular movements normal; neck is supple without thyromegally  Lynphnodes: Supraclavicular and inguinal nodes not enlarged  Abdomen: Soft, non-tender, no ascites, no organomegally, no masses, no hernias  Pelvic:  EGBUS: Normal female  Vagina: Normal, no lesions , she does have a moderate cystocele extending to the introitus. The apex of the vagina is well supported.Marland Kitchen Urethra and Bladder: Normal, non-tender  Cervix: Surgically absent  Uterus: Surgically absent  Bi-manual examination: Non-tender; no adenxal masses or nodularity  Rectal: normal sphincter tone, no masses, no blood  Lower extremities: No edema or varicosities. Normal range of motion      Allergies  Allergen Reactions  . Ciprofloxacin Swelling  . Morphine And Related Nausea And Vomiting  . Penicillins Nausea Only  . Sulfur Hives    Past Medical History  Diagnosis Date  . Osteoporosis   . Allergy   . Uterine cancer 2010    Endometrial Stromal Sarcoma    History reviewed. No pertinent past surgical history.  Current  Outpatient Prescriptions  Medication Sig Dispense Refill  . AFLURIA PRESERVATIVE FREE 0.5 ML SUSY   0  . bisacodyl (DULCOLAX) 5 MG EC tablet Take 5 mg by mouth daily as needed for  moderate constipation.    . cetirizine (ZYRTEC) 10 MG tablet Take 10 mg by mouth daily as needed for allergies.    . Cholecalciferol (VITAMIN D3) 1000 UNITS CAPS Take 1 capsule by mouth daily.    . cyclobenzaprine (FLEXERIL) 10 MG tablet Take 5 mg by mouth at bedtime as needed.    Marland Kitchen DIGESTIVE AIDS MIXTURE PO Take 1 tablet by mouth daily.    Marland Kitchen docusate sodium (COLACE) 100 MG capsule Take 100 mg by mouth 2 (two) times daily.    . fluticasone (FLONASE) 50 MCG/ACT nasal spray Place into both nostrils daily as needed for allergies or rhinitis.    . fulvestrant (FASLODEX) 250 MG/5ML injection Inject 500 mg into the muscle every 30 (thirty) days. One injection each buttock over 1-2 minutes. Warm prior to use.    . Multiple Vitamin (MULTIVITAMIN) capsule Take 1 capsule by mouth daily.    . polyethylene glycol (MIRALAX / GLYCOLAX) packet Take 17 g by mouth daily as needed.    . Simethicone (GAS-X PO) Take 1 tablet by mouth as needed.    . Calcium-Vitamin D-Vitamin K (VIACTIV) 177-939-03 MG-UNT-MCG CHEW Chew 1 tablet by mouth daily.     No current facility-administered medications for this visit.    History   Social History  . Marital Status: Married    Spouse Name: N/A    Number of Children: N/A  . Years of Education: N/A   Occupational History  . Not on file.   Social History Main Topics  . Smoking status: Never Smoker   . Smokeless tobacco: Not on file  . Alcohol Use: Yes     Comment: 1-2 per week  . Drug Use: No  . Sexual Activity: Not on file   Other Topics Concern  . Not on file   Social History Narrative    Family History  Problem Relation Age of Onset  . Breast cancer Mother 66    lobular breast cancer  . Lymphoma Father 47  . Breast cancer Sister 34    lobular breast cancer  . Stroke Maternal Grandfather   . Leukemia Paternal Grandmother     dx in her 54s  . Heart attack Paternal Lindajo Royal, MD 06/11/2014, 8:56 AM

## 2014-06-11 NOTE — Patient Instructions (Addendum)
Return to see [place] in 6 months. We will obtain an MRI prior to that visit.  You will receive a phone call from the [facility] to arrange an appointment in the high risk clinic.

## 2014-06-14 ENCOUNTER — Telehealth: Payer: Self-pay | Admitting: Hematology and Oncology

## 2014-06-17 ENCOUNTER — Telehealth: Payer: Self-pay | Admitting: Genetic Counselor

## 2014-06-17 ENCOUNTER — Encounter: Payer: Self-pay | Admitting: Genetic Counselor

## 2014-06-17 NOTE — Telephone Encounter (Signed)
Karla Watts called to see if her test results were back yet.  I called [name], they are due tomorrow.  They have moved the results to a priority to get them out today.  [name] wants to take them with her to her meeting at [facility] and she will be leaving tomorrow.

## 2014-06-17 NOTE — Telephone Encounter (Signed)
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  Discussed that we do not change medical management on VUS'.

## 2014-06-18 ENCOUNTER — Encounter: Payer: BC Managed Care – PPO | Admitting: Nurse Practitioner

## 2014-06-18 ENCOUNTER — Telehealth: Payer: Self-pay | Admitting: *Deleted

## 2014-06-18 ENCOUNTER — Other Ambulatory Visit (HOSPITAL_BASED_OUTPATIENT_CLINIC_OR_DEPARTMENT_OTHER): Payer: BC Managed Care – PPO

## 2014-06-18 ENCOUNTER — Ambulatory Visit: Payer: BC Managed Care – PPO | Admitting: Gynecology

## 2014-06-18 ENCOUNTER — Ambulatory Visit (HOSPITAL_BASED_OUTPATIENT_CLINIC_OR_DEPARTMENT_OTHER): Payer: BC Managed Care – PPO

## 2014-06-18 DIAGNOSIS — Z5111 Encounter for antineoplastic chemotherapy: Secondary | ICD-10-CM

## 2014-06-18 DIAGNOSIS — C541 Malignant neoplasm of endometrium: Secondary | ICD-10-CM

## 2014-06-18 LAB — COMPREHENSIVE METABOLIC PANEL (CC13)
ALT: 25 U/L (ref 0–55)
AST: 19 U/L (ref 5–34)
Albumin: 4.2 g/dL (ref 3.5–5.0)
Alkaline Phosphatase: 81 U/L (ref 40–150)
Anion Gap: 7 mEq/L (ref 3–11)
BUN: 14.5 mg/dL (ref 7.0–26.0)
CO2: 28 mEq/L (ref 22–29)
Calcium: 10.1 mg/dL (ref 8.4–10.4)
Chloride: 106 mEq/L (ref 98–109)
Creatinine: 0.8 mg/dL (ref 0.6–1.1)
Glucose: 97 mg/dl (ref 70–140)
Potassium: 4.1 mEq/L (ref 3.5–5.1)
Sodium: 141 mEq/L (ref 136–145)
Total Bilirubin: 0.39 mg/dL (ref 0.20–1.20)
Total Protein: 7.2 g/dL (ref 6.4–8.3)

## 2014-06-18 LAB — CBC WITH DIFFERENTIAL/PLATELET
BASO%: 0.8 % (ref 0.0–2.0)
Basophils Absolute: 0.1 10*3/uL (ref 0.0–0.1)
EOS%: 2.9 % (ref 0.0–7.0)
Eosinophils Absolute: 0.3 10*3/uL (ref 0.0–0.5)
HCT: 41 % (ref 34.8–46.6)
HGB: 13.2 g/dL (ref 11.6–15.9)
LYMPH%: 37.6 % (ref 14.0–49.7)
MCH: 28.5 pg (ref 25.1–34.0)
MCHC: 32.2 g/dL (ref 31.5–36.0)
MCV: 88.4 fL (ref 79.5–101.0)
MONO#: 0.7 10*3/uL (ref 0.1–0.9)
MONO%: 8.1 % (ref 0.0–14.0)
NEUT#: 4.4 10*3/uL (ref 1.5–6.5)
NEUT%: 50.6 % (ref 38.4–76.8)
Platelets: 268 10*3/uL (ref 145–400)
RBC: 4.64 10*6/uL (ref 3.70–5.45)
RDW: 12.9 % (ref 11.2–14.5)
WBC: 8.7 10*3/uL (ref 3.9–10.3)
lymph#: 3.3 10*3/uL (ref 0.9–3.3)

## 2014-06-18 MED ORDER — FULVESTRANT 250 MG/5ML IM SOLN
500.0000 mg | Freq: Once | INTRAMUSCULAR | Status: AC
  Administered 2014-06-18: 500 mg via INTRAMUSCULAR
  Filled 2014-06-18: qty 10

## 2014-06-18 NOTE — Patient Instructions (Signed)
Fulvestrant injection  What is this medicine?  FULVESTRANT (ful VES trant) blocks the effects of estrogen. It is used to treat breast cancer in women past the age of menopause.  This medicine may be used for other purposes; ask your health care provider or pharmacist if you have questions.  COMMON BRAND NAME(S): FASLODEX  What should I tell my health care provider before I take this medicine?  They need to know if you have any of these conditions:  -bleeding problems  -liver disease  -low levels of platelets in the blood  -an unusual or allergic reaction to fulvestrant, other medicines, foods, dyes, or preservatives  -pregnant or trying to get pregnant  -breast-feeding  How should I use this medicine?  This medicine is for injection into a muscle. It is usually given by a health care professional in a hospital or clinic setting.  Talk to your pediatrician regarding the use of this medicine in children. Special care may be needed.  Overdosage: If you think you have taken too much of this medicine contact a poison control center or emergency room at once.  NOTE: This medicine is only for you. Do not share this medicine with others.  What if I miss a dose?  It is important not to miss your dose. Call your doctor or health care professional if you are unable to keep an appointment.  What may interact with this medicine?  -medicines that treat or prevent blood clots like warfarin, enoxaparin, and dalteparin  This list may not describe all possible interactions. Give your health care provider a list of all the medicines, herbs, non-prescription drugs, or dietary supplements you use. Also tell them if you smoke, drink alcohol, or use illegal drugs. Some items may interact with your medicine.  What should I watch for while using this medicine?  Your condition will be monitored carefully while you are receiving this medicine. You will need important blood work done while you are taking this medicine.  Do not become pregnant  while taking this medicine. Women should inform their doctor if they wish to become pregnant or think they might be pregnant. There is a potential for serious side effects to an unborn child. Talk to your health care professional or pharmacist for more information.  What side effects may I notice from receiving this medicine?  Side effects that you should report to your doctor or health care professional as soon as possible:  -allergic reactions like skin rash, itching or hives, swelling of the face, lips, or tongue  -feeling faint or lightheaded, falls  -fever or flu-like symptoms  -sore throat  -vaginal bleeding  Side effects that usually do not require medical attention (report to your doctor or health care professional if they continue or are bothersome):  -aches, pains  -constipation or diarrhea  -headache  -hot flashes  -nausea, vomiting  -pain at site where injected  -stomach pain  This list may not describe all possible side effects. Call your doctor for medical advice about side effects. You may report side effects to FDA at 1-800-FDA-1088.  Where should I keep my medicine?  This drug is given in a hospital or clinic and will not be stored at home.  NOTE: This sheet is a summary. It may not cover all possible information. If you have questions about this medicine, talk to your doctor, pharmacist, or health care provider.   2015, Elsevier/Gold Standard. (2007-12-01 15:39:24)

## 2014-06-18 NOTE — Telephone Encounter (Signed)
Received ok from Skip to schedule the pt.  Called and left a message for the pt to return my call so I can schedule her for High Risk.

## 2014-06-24 ENCOUNTER — Telehealth: Payer: Self-pay | Admitting: *Deleted

## 2014-06-24 ENCOUNTER — Telehealth: Payer: Self-pay | Admitting: Oncology

## 2014-06-24 NOTE — Telephone Encounter (Signed)
Pt returned my call and I confirmed 08/16/14 high risk appt w/ her.

## 2014-06-24 NOTE — Telephone Encounter (Signed)
Left message for the pt to return my call so I can schedule a high risk appt w/ her.

## 2014-06-24 NOTE — Telephone Encounter (Signed)
, °

## 2014-07-14 ENCOUNTER — Other Ambulatory Visit (HOSPITAL_BASED_OUTPATIENT_CLINIC_OR_DEPARTMENT_OTHER): Payer: BC Managed Care – PPO

## 2014-07-14 ENCOUNTER — Ambulatory Visit (HOSPITAL_BASED_OUTPATIENT_CLINIC_OR_DEPARTMENT_OTHER): Payer: BC Managed Care – PPO

## 2014-07-14 DIAGNOSIS — C541 Malignant neoplasm of endometrium: Secondary | ICD-10-CM

## 2014-07-14 DIAGNOSIS — Z5111 Encounter for antineoplastic chemotherapy: Secondary | ICD-10-CM

## 2014-07-14 LAB — CBC WITH DIFFERENTIAL/PLATELET
BASO%: 0.7 % (ref 0.0–2.0)
Basophils Absolute: 0.1 10*3/uL (ref 0.0–0.1)
EOS%: 2 % (ref 0.0–7.0)
Eosinophils Absolute: 0.2 10*3/uL (ref 0.0–0.5)
HCT: 42.2 % (ref 34.8–46.6)
HGB: 13.9 g/dL (ref 11.6–15.9)
LYMPH%: 35.5 % (ref 14.0–49.7)
MCH: 29 pg (ref 25.1–34.0)
MCHC: 32.9 g/dL (ref 31.5–36.0)
MCV: 88.2 fL (ref 79.5–101.0)
MONO#: 0.4 10*3/uL (ref 0.1–0.9)
MONO%: 5.4 % (ref 0.0–14.0)
NEUT#: 4.7 10*3/uL (ref 1.5–6.5)
NEUT%: 56.4 % (ref 38.4–76.8)
Platelets: 247 10*3/uL (ref 145–400)
RBC: 4.78 10*6/uL (ref 3.70–5.45)
RDW: 12.8 % (ref 11.2–14.5)
WBC: 8.3 10*3/uL (ref 3.9–10.3)
lymph#: 2.9 10*3/uL (ref 0.9–3.3)

## 2014-07-14 LAB — COMPREHENSIVE METABOLIC PANEL (CC13)
ALT: 20 U/L (ref 0–55)
AST: 18 U/L (ref 5–34)
Albumin: 4.1 g/dL (ref 3.5–5.0)
Alkaline Phosphatase: 93 U/L (ref 40–150)
Anion Gap: 12 mEq/L — ABNORMAL HIGH (ref 3–11)
BUN: 16.1 mg/dL (ref 7.0–26.0)
CO2: 25 mEq/L (ref 22–29)
Calcium: 9.9 mg/dL (ref 8.4–10.4)
Chloride: 105 mEq/L (ref 98–109)
Creatinine: 0.8 mg/dL (ref 0.6–1.1)
EGFR: 83 mL/min/{1.73_m2} — ABNORMAL LOW (ref >90)
Glucose: 150 mg/dl — ABNORMAL HIGH (ref 70–140)
Potassium: 4.1 mEq/L (ref 3.5–5.1)
Sodium: 142 mEq/L (ref 136–145)
Total Bilirubin: 0.47 mg/dL (ref 0.20–1.20)
Total Protein: 7.1 g/dL (ref 6.4–8.3)

## 2014-07-14 MED ORDER — FULVESTRANT 250 MG/5ML IM SOLN
500.0000 mg | Freq: Once | INTRAMUSCULAR | Status: AC
  Administered 2014-07-14: 500 mg via INTRAMUSCULAR
  Filled 2014-07-14: qty 10

## 2014-07-16 ENCOUNTER — Ambulatory Visit: Payer: BC Managed Care – PPO

## 2014-07-16 ENCOUNTER — Other Ambulatory Visit: Payer: BC Managed Care – PPO

## 2014-08-08 ENCOUNTER — Other Ambulatory Visit: Payer: Self-pay | Admitting: Oncology

## 2014-08-13 ENCOUNTER — Other Ambulatory Visit: Payer: BC Managed Care – PPO

## 2014-08-13 ENCOUNTER — Ambulatory Visit: Payer: BC Managed Care – PPO

## 2014-08-16 ENCOUNTER — Encounter: Payer: Self-pay | Admitting: Oncology

## 2014-08-16 ENCOUNTER — Encounter: Payer: Self-pay | Admitting: Hematology and Oncology

## 2014-08-16 ENCOUNTER — Other Ambulatory Visit (HOSPITAL_BASED_OUTPATIENT_CLINIC_OR_DEPARTMENT_OTHER): Payer: BLUE CROSS/BLUE SHIELD

## 2014-08-16 ENCOUNTER — Ambulatory Visit (HOSPITAL_BASED_OUTPATIENT_CLINIC_OR_DEPARTMENT_OTHER): Payer: BLUE CROSS/BLUE SHIELD

## 2014-08-16 ENCOUNTER — Telehealth: Payer: Self-pay | Admitting: Oncology

## 2014-08-16 ENCOUNTER — Ambulatory Visit (HOSPITAL_BASED_OUTPATIENT_CLINIC_OR_DEPARTMENT_OTHER): Payer: Self-pay | Admitting: Oncology

## 2014-08-16 ENCOUNTER — Ambulatory Visit (HOSPITAL_BASED_OUTPATIENT_CLINIC_OR_DEPARTMENT_OTHER): Payer: BLUE CROSS/BLUE SHIELD | Admitting: Hematology and Oncology

## 2014-08-16 VITALS — BP 137/81 | HR 87 | Temp 98.3°F | Resp 18 | Ht 64.75 in | Wt 144.5 lb

## 2014-08-16 VITALS — BP 141/60 | HR 70 | Temp 98.3°F | Resp 18 | Ht 64.75 in | Wt 144.3 lb

## 2014-08-16 DIAGNOSIS — D390 Neoplasm of uncertain behavior of uterus: Secondary | ICD-10-CM

## 2014-08-16 DIAGNOSIS — C541 Malignant neoplasm of endometrium: Secondary | ICD-10-CM

## 2014-08-16 DIAGNOSIS — R350 Frequency of micturition: Secondary | ICD-10-CM

## 2014-08-16 DIAGNOSIS — D242 Benign neoplasm of left breast: Secondary | ICD-10-CM

## 2014-08-16 DIAGNOSIS — Z5112 Encounter for antineoplastic immunotherapy: Secondary | ICD-10-CM

## 2014-08-16 DIAGNOSIS — Z803 Family history of malignant neoplasm of breast: Secondary | ICD-10-CM

## 2014-08-16 LAB — CBC WITH DIFFERENTIAL/PLATELET
BASO%: 0.6 % (ref 0.0–2.0)
Basophils Absolute: 0.1 10*3/uL (ref 0.0–0.1)
EOS%: 2.1 % (ref 0.0–7.0)
Eosinophils Absolute: 0.2 10*3/uL (ref 0.0–0.5)
HCT: 43.5 % (ref 34.8–46.6)
HGB: 14.1 g/dL (ref 11.6–15.9)
LYMPH%: 37.3 % (ref 14.0–49.7)
MCH: 28.7 pg (ref 25.1–34.0)
MCHC: 32.5 g/dL (ref 31.5–36.0)
MCV: 88.3 fL (ref 79.5–101.0)
MONO#: 0.6 10*3/uL (ref 0.1–0.9)
MONO%: 6.9 % (ref 0.0–14.0)
NEUT#: 4.8 10*3/uL (ref 1.5–6.5)
NEUT%: 53.1 % (ref 38.4–76.8)
Platelets: 292 10*3/uL (ref 145–400)
RBC: 4.93 10*6/uL (ref 3.70–5.45)
RDW: 13 % (ref 11.2–14.5)
WBC: 9.1 10*3/uL (ref 3.9–10.3)
lymph#: 3.4 10*3/uL — ABNORMAL HIGH (ref 0.9–3.3)

## 2014-08-16 LAB — COMPREHENSIVE METABOLIC PANEL (CC13)
ALT: 21 U/L (ref 0–55)
AST: 21 U/L (ref 5–34)
Albumin: 4.4 g/dL (ref 3.5–5.0)
Alkaline Phosphatase: 94 U/L (ref 40–150)
Anion Gap: 9 mEq/L (ref 3–11)
BUN: 13.7 mg/dL (ref 7.0–26.0)
CO2: 29 mEq/L (ref 22–29)
Calcium: 10.2 mg/dL (ref 8.4–10.4)
Chloride: 105 mEq/L (ref 98–109)
Creatinine: 0.8 mg/dL (ref 0.6–1.1)
EGFR: 88 mL/min/{1.73_m2} — ABNORMAL LOW (ref >90)
Glucose: 85 mg/dl (ref 70–140)
Potassium: 3.9 mEq/L (ref 3.5–5.1)
Sodium: 142 mEq/L (ref 136–145)
Total Bilirubin: 0.4 mg/dL (ref 0.20–1.20)
Total Protein: 7.4 g/dL (ref 6.4–8.3)

## 2014-08-16 MED ORDER — FULVESTRANT 250 MG/5ML IM SOLN
500.0000 mg | Freq: Once | INTRAMUSCULAR | Status: AC
  Administered 2014-08-16: 500 mg via INTRAMUSCULAR
  Filled 2014-08-16: qty 10

## 2014-08-16 NOTE — Patient Instructions (Signed)
Hemoccult cards:  Do within one week of next office appointment and bring back to lab then. Do not need to adjust diet prior to the cards. With toilet tissue after wiping bowel movement, touch stool sample to each of the 2 little rectangular windows on the hemoccult card - one card for each of 3 different bowel movements within a week of bringing cards to be developed.

## 2014-08-16 NOTE — Progress Notes (Signed)
OFFICE PROGRESS NOTE   08/16/2014   Physicians:Daniel ClarkePearson, Knox Saliva ([facility]), Verl Blalock (rheumatology, [place]), Leda Roys (PCP, [facility]), Suzan Garibaldi (urologic surgeon, [facility]), (J.Matthew McDonald gyn onc [place]), [address] (GI, [place]), _ [facility], [place] Ash  INTERVAL HISTORY:   Patient is seen, alone for visit, in scheduled follow up of recurrent low grade endometrial stromal sarcoma for which she has been receiving monthly Faslodex since ~ 06-2013 at recommendation of Dr Knox Saliva of [facility]. Last MRI abdomen pelvis was in [facility] 06-09-14, after which she saw Dr Josephina Shih and Dr Mauro Kaufmann, with recommendations to continue Faslodex. She will have repeat MRI in 6 months, with visit back to Dr Josephina Shih then.   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 Note mother had lobular breast ca age 49 and sister lobular breast ca age 16, reportedly with same variant on genetics testing. Prior to my visit today, patient met with Dr Nicholas Lose for high risk counseling, with discussion of the findings on genetics testing as well as breast cancer risk and recommendations, including breast MRI. See his full note also from today. She will be due tomo mammograms at [facility] in June, last 02-02-14 with heterogeneously dense breast tissue. Last colonoscopy 11-10-2013.  She is tolerating Faslodex still well overall, and is reassured that she may be obtaining some preventative benefit from this from standpoint of breast cancer risk. She has had no problems at injection sites with careful administration.  Primary complaint today is of more urinary frequency and urgency, which she feels is clearly different from UTI. She is again voiding hourly while awake and several times thru night, which is more than after bladder tack by Dr Suzan Garibaldi in [place]. She has not been able to schedule visit back to  Dr Kristine Linea; she tells me that Dr Josephina Shih had given her recommendation for urologic surgeon at [facility] prior to the bladder tack in [place]. She will be in touch with Dr Josephina Shih by note, with assistance of gyn onc staff here, as symptoms likely need to be addressed prior to his 12-09-14 appointment. Dr Josephina Shih did mention cystocoele in 06-2014, felt relatively asymptomatic then. She denies hematuria, pelvic or abdominal pain, other bleeding. Bowels are moving daily with daily miralax, which has been a significant improvement. Appetite and energy seem at baseline. No LE swelling. No complaints of back pain. No respiratory symptoms. No noted changes in breasts bilaterally. Hot flashes are no worse, tolerable.   No PAC Flu vaccine done Genetics testing: CancerNext panel from 05-2014 with MSH2 VUS  .  ONCOLOGIC HISTORY History is of abdominal pain early 2010, with CT March 2010 with 8 cm intrauterine mass consistent with fibroid, and preop endometrial biopsy and PAP normal. She had TAH BSO and staging 12-03-2008 by gyn oncology at [facility] in [place], with finding of stage IB low grade endometrial stromal sarcoma. That pathology ([id number] from 12-03-2008) was reviewed at [facility] 01-31-2009, the [facility] report to be scanned into this EMR: low grade endometrial stromal sarcoma 9.5 cm, confined to uterine corpus, + LVSI, 0/19 nodes involved, benign bilateral ovaries and tubes, benign cervix; "per outside report, the tumor is diffusely and strongly positive for estrogen receptor and progesterone receptor".  She had severe hot flashes after surgery/ prior to beginning aromatase inhibitor. She was seen in consultation by Dr Carlton Adam began [place] 02-2009, which she tolerated poorly due to progressive joint aches, worse hot flashes and related insomnia; she DCd Femara  in 12-2009 due to those symptoms. She tried Aromasin from 01-2010 thru 03-2010, also unable to tolerate "due to insomnia". She  briefly resumed Femara at 1/2 prior dose. From 04-2012 thru 02-2013 she took raloxifene, prescribed by rheumatologist for low bone density. She has been followed with MRI scans (all at [facility]) and exams by Dr Josephina Shih now every 3 months.  Patient developed symptomatic vaginal vault prolapse, with robotically assisted laparoscopic vaginal vault suspension by Dr Suzan Garibaldi, urogynecology at [facility] in [place], on 04-22-2013. At time of surgery, she had "5 mm nodules of recurrent low grade endometrial stromal sarcoma" resected. (Operative note and that path not available in present records, however path was reviewed at [facility], consistent with low grade endometrial stromal sarcoma). She was seen for second opinion consultation at [facility] by Dr Chilton Greathouse on 05-22-13 (consult note copied from patient's records now and will be scanned into EMR). The consultation note suggests that all of this incidentally found recurrence was resected at time of the urogynecology procedure; recommendation was for monthly Faslodex. She had #3 Faslodex at [facility] 09-10-2013 and has continued monthly injections at [facility] since 10-08-2013. MRI AP 12-21-13 had no evidence of disease. MRI AP 06-09-14 likewise no evidence of disease.    Review of systems as above, also: No fever, no other symptoms of infection. No noted changes in breasts.  Remainder of 10 point Review of Systems negative.  Objective:  Vital signs in last 24 hours:  BP 141/60 mmHg  Pulse 70  Temp(Src) 98.3 F (36.8 C) (Oral)  Resp 18  Ht 5' 4.75" (1.645 m)  Wt 144 lb 4.8 oz (65.454 kg)  BMI 24.19 kg/m2 weight stable  Alert, oriented and appropriate, very pleasant and excellent historian as always. Ambulatory without difficulty.    HEENT:PERRL, sclerae not icteric. Oral mucosa moist without lesions, posterior pharynx clear.  Neck supple. No JVD.  Lymphatics:no cervical,supraclavicular, axillary or inguinal adenopathy Resp: clear to auscultation  bilaterally and normal percussion bilaterally Cardio: regular rate and rhythm. No gallop. GI: soft, nontender, not distended, no mass or organomegaly. Normally active bowel sounds. Surgical incision not remarkable. Musculoskeletal/ Extremities: without pitting edema, cords, tenderness Neuro: no peripheral neuropathy. Otherwise nonfocal. PSYCH appropriate mood and affect Skin without rash, ecchymosis, petechiae Breasts: without dominant mass, skin or nipple findings. Axillae benign.   Lab Results:  Results for orders placed or performed in visit on 08/16/14  CBC with Differential  Result Value Ref Range   WBC 9.1 3.9 - 10.3 10e3/uL   NEUT# 4.8 1.5 - 6.5 10e3/uL   HGB 14.1 11.6 - 15.9 g/dL   HCT 43.5 34.8 - 46.6 %   Platelets 292 145 - 400 10e3/uL   MCV 88.3 79.5 - 101.0 fL   MCH 28.7 25.1 - 34.0 pg   MCHC 32.5 31.5 - 36.0 g/dL   RBC 4.93 3.70 - 5.45 10e6/uL   RDW 13.0 11.2 - 14.5 %   lymph# 3.4 (H) 0.9 - 3.3 10e3/uL   MONO# 0.6 0.1 - 0.9 10e3/uL   Eosinophils Absolute 0.2 0.0 - 0.5 10e3/uL   Basophils Absolute 0.1 0.0 - 0.1 10e3/uL   NEUT% 53.1 38.4 - 76.8 %   LYMPH% 37.3 14.0 - 49.7 %   MONO% 6.9 0.0 - 14.0 %   EOS% 2.1 0.0 - 7.0 %   BASO% 0.6 0.0 - 2.0 %  Comprehensive metabolic panel (Cmet) - [facility]  Result Value Ref Range   Sodium 142 136 - 145 mEq/L  Potassium 3.9 3.5 - 5.1 mEq/L   Chloride 105 98 - 109 mEq/L   CO2 29 22 - 29 mEq/L   Glucose 85 70 - 140 mg/dl   BUN 13.7 7.0 - 26.0 mg/dL   Creatinine 0.8 0.6 - 1.1 mg/dL   Total Bilirubin 0.40 0.20 - 1.20 mg/dL   Alkaline Phosphatase 94 40 - 150 U/L   AST 21 5 - 34 U/L   ALT 21 0 - 55 U/L   Total Protein 7.4 6.4 - 8.3 g/dL   Albumin 4.4 3.5 - 5.0 g/dL   Calcium 10.2 8.4 - 10.4 mg/dL   Anion Gap 9 3 - 11 mEq/L   EGFR 88 (L) >90 ml/min/1.73 m2     Studies/Results:  MRI ABDOMEN AND PELVIS WITHOUT AND WITH CONTRAST  06-09-14  COMPARISON: 12/21/2013  FINDINGS: Lower chest: Normal heart size without  pericardial or pleural effusion.  Hepatobiliary: Normal liver and gallbladder, without biliary ductal dilatation.  Pancreas: Normal, without mass or pancreatic ductal dilatation.  Spleen: Normal  Adrenals/Urinary Tract: Normal adrenal glands. Normal kidneys, without hydronephrosis or hydroureter. Normal bladder.  Stomach/Bowel: Normal stomach, without wall thickening. Normal small bowel. Colonic stool burden suggests constipation.  Vascular/Lymphatic: No aneurysm. No abdominopelvic adenopathy.  Reproductive: Status post hysterectomy. No locally recurrent disease. No adnexal mass.  Other: No abdominal pelvic ascites.  Musculoskeletal: No focal osseous abnormality.  IMPRESSION: 1. Status post hysterectomy, without recurrent or metastatic disease. 2. Possible constipation.  Medications: I have reviewed the patient's current medications.  DISCUSSION: MRI, genetics, bladder symptoms, other interval history all as above. She was comfortable with discussion with Dr Lindi Adie today. Communication with gyn onc re bladder symptoms now. Continue faslodex monthly.  Assessment/Plan: 1.low grade endometrial stromal sarcoma: history as above, incidentally found recurrent at urogynecologic procedure 04-2013, now on monthly Faslodex since ~ Nov 2014 per recommendation from [facility]. Follow up MRI AP late April as scheduled, Dr Josephina Shih early May. I will see her back coordinating with Faslodex dates and other MDs. 2.Increased urinary urgency and frequency, now again voiding every hour. Known cystocoele. Patient/ gyn onc staff to be in touch with Dr Josephina Shih for recommendations. Post repair of vaginal prolapse 04-2013 and bladder tuck done in [place] (I am not clear about date). 3.history of intraductal papilloma left breast with atypia 1998, mother and sister with lobular breast cancers, another sister with high risk for breast cancer. 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 Appreciate Dr Geralyn Flash consultation today, agree with breast MRI, which will need to be within 3 months after mammograms. 4.osteoporosis: Reclast given 08-2013, on calcium with D and supplemental D3, now doing regular exercise  5.deafness right ear since childhood  6.hx basal cell skin ca  7.multiple miscarriages  8.osteoarthritis  9. Arthralgias from aromatase inhibitors, resolved 10. Multiple other medication allergies/ intolerances as listed   Faslodex orders confirmed. Patient knows that she can contact this office if needed prior to next scheduled visit.    LIVESAY,LENNIS P, MD   08/16/2014, 5:07 PM

## 2014-08-16 NOTE — Progress Notes (Signed)
[place] NOTE  Patient Care Team: Leda Roys, MD as PCP - General (Internal Medicine)  CHIEF COMPLAINTS/PURPOSE OF CONSULTATION:  High risk breast clinic consultation  HISTORY OF PRESENTING ILLNESS:  Karla Watts 59 y.o. 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 Patient is a prior history of low-grade endometrial sarcoma that had relapsed and is currently being treated with Faslodex injections. Because of personal and and family history of breast cancer in both mother and sister, she underwent testing which revealed the area of unknown significance. She had a recent colonoscopy and upper endoscopy which were normal. She is worried about breast cancer recurrence because of the high incidence of invasive lobular cancer both mother and sister. They both had mastectomies. Apparently did not have any Gene mutations.  I reviewed her records extensively and collaborated the history with the patient.  MEDICAL HISTORY:  Past Medical History  Diagnosis Date  . Osteoporosis   . Allergy   . Uterine cancer 2010    Endometrial Stromal Sarcoma    SURGICAL HISTORY: History reviewed. No pertinent past surgical history.  SOCIAL HISTORY: History   Social History  . Marital Status: Married    Spouse Name: N/A    Number of Children: N/A  . Years of Education: N/A   Occupational History  . Not on file.   Social History Main Topics  . Smoking status: Never Smoker   . Smokeless tobacco: Not on file  . Alcohol Use: Yes     Comment: 1-2 per week  . Drug Use: No  . Sexual Activity: Not on file   Other Topics Concern  . Not on file   Social History Narrative    FAMILY HISTORY: Family History  Problem Relation Age of Onset  . Breast cancer Mother 21    lobular breast cancer  . Lymphoma Father 21  . Breast cancer Sister 72    lobular breast cancer  . Stroke Maternal Grandfather   . Leukemia Paternal  Grandmother     dx in her 28s  . Heart attack Paternal Grandfather     ALLERGIES:  is allergic to ciprofloxacin; morphine and related; penicillins; and sulfur.  MEDICATIONS:  Current Outpatient Prescriptions  Medication Sig Dispense Refill  . AFLURIA PRESERVATIVE FREE 0.5 ML SUSY   0  . bisacodyl (DULCOLAX) 5 MG EC tablet Take 5 mg by mouth daily as needed for moderate constipation.    . Calcium-Vitamin D-Vitamin K (VIACTIV) [id number] MG-UNT-MCG CHEW Chew 1 tablet by mouth daily.    . cetirizine (ZYRTEC) 10 MG tablet Take 10 mg by mouth daily as needed for allergies.    . Cholecalciferol (VITAMIN D3) 1000 UNITS CAPS Take 1 capsule by mouth daily.    . cyclobenzaprine (FLEXERIL) 10 MG tablet Take 5 mg by mouth at bedtime as needed.    . fulvestrant (FASLODEX) 250 MG/5ML injection Inject 500 mg into the muscle every 30 (thirty) days. One injection each buttock over 1-2 minutes. Warm prior to use.    . Multiple Vitamin (MULTIVITAMIN) capsule Take 1 capsule by mouth daily.    . polyethylene glycol (MIRALAX / GLYCOLAX) packet Take 17 g by mouth daily.     . Simethicone (GAS-X PO) Take 1 tablet by mouth as needed.     No current facility-administered medications for this visit.    REVIEW OF SYSTEMS:   Constitutional: Denies fevers, chills or abnormal night sweats Eyes: Denies blurriness of vision,  double vision or watery eyes Ears, nose, mouth, throat, and face: Denies mucositis or sore throat Respiratory: Denies cough, dyspnea or wheezes Cardiovascular: Denies palpitation, chest discomfort or lower extremity swelling Gastrointestinal:  Denies nausea, heartburn or change in bowel habits Skin: Denies abnormal skin rashes Lymphatics: Denies new lymphadenopathy or easy bruising Neurological:Denies numbness, tingling or new weaknesses Behavioral/Psych: Mood is stable, no new changes  Breast:  Denies any palpable lumps or discharge All other systems were reviewed with the patient and are  negative.  PHYSICAL EXAMINATION: ECOG PERFORMANCE STATUS: 0 - Asymptomatic  Filed Vitals:   08/16/14 0910  BP: 137/81  Pulse: 87  Temp: 98.3 F (36.8 C)  Resp: 18   Filed Weights   08/16/14 0910  Weight: 144 lb 8 oz (65.545 kg)    GENERAL:alert, no distress and comfortable SKIN: skin color, texture, turgor are normal, no rashes or significant lesions EYES: normal, conjunctiva are pink and non-injected, sclera clear OROPHARYNX:no exudate, no erythema and lips, buccal mucosa, and tongue normal  NECK: supple, thyroid normal size, non-tender, without nodularity LYMPH:  no palpable lymphadenopathy in the cervical, axillary or inguinal LUNGS: clear to auscultation and percussion with normal breathing effort HEART: regular rate & rhythm and no murmurs and no lower extremity edema ABDOMEN:abdomen soft, non-tender and normal bowel sounds Musculoskeletal:no cyanosis of digits and no clubbing  PSYCH: alert & oriented x 3 with fluent speech NEURO: no focal motor/sensory deficits  LABORATORY DATA:  I have reviewed the data as listed Lab Results  Component Value Date   WBC 9.1 08/16/2014   HGB 14.1 08/16/2014   HCT 43.5 08/16/2014   MCV 88.3 08/16/2014   PLT 292 08/16/2014   Lab Results  Component Value Date   NA 142 08/16/2014   K 3.9 08/16/2014   CO2 29 08/16/2014   ASSESSMENT AND PLAN:  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 At the current time, this alteration has not been identified with HNPCC syndrome or Lynch syndrome. Hence there is no clear indication for more intense screening evaluation or monitoring required for colorectal cancer. Age appropriate screening is a standard of care. If additional information is to be obtained in the future, this might change. I discussed with the patient the standard screening guidelines for colon cancer.  Breast cancer  evaluation and Counseling: We subsequently discussed the indications for high risk breast cancer. Patient has a Baker Janus risk score of 40 based on her mother and a sister having been diagnosed with breast cancer. Neither of them had any abnormal genetic mutations. Given the fact that she is now taking Faslodex, this should decrease the risk of breast cancer recurrence  by 50%. I recommended continuing with this approach. I also recommended that she should be screened with annual mammograms. We discussed the pros and cons of MRI testing. With her risk of recurrence still at greater than 20%, MRI screening is also a reasonable approach. We would like to do MRIs on her once every 3 years. Patient understands that [facility] does not have any association with breast cancer.  We will order the MRI scan and follow-up on an as-needed basis  Rulon Eisenmenger, MD 08/16/2014 3:55 PM

## 2014-08-16 NOTE — Assessment & Plan Note (Addendum)
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 At the current time, this alteration has not been identified with HNPCC syndrome or Lynch syndrome. Hence there is no clear indication for more intense screening evaluation or monitoring required for colorectal cancer. Age appropriate screening is a standard of care. If additional information is to be obtained in the future, this might change. I discussed with the patient the standard screening guidelines for colon cancer.  Breast cancer evaluation and Counseling: We subsequently discussed the indications for high risk breast cancer. Patient has a Baker Janus risk score of 40 based on her mother and a sister having been diagnosed with breast cancer. Neither of them had any abnormal genetic mutations. Given the fact that she is now taking Faslodex, this should decrease the risk of breast cancer recurrence  by 50%. I recommended continuing with this approach. I also recommended that she should be screened with annual mammograms. We discussed the pros and cons of MRI testing. With her risk of recurrence still at greater than 20%, MRI screening is also a reasonable approach. We would like to do MRIs on her once every 3 years. Patient understands that [facility] does not have any association with breast cancer.  We will order the MRI scan and follow-up on an as-needed basis

## 2014-08-16 NOTE — Telephone Encounter (Signed)
per pof to sch pt appt-pt stated wuill call * make mamma herself-LL req visit on 5/31 sch not opened-will print and sch once open

## 2014-08-17 ENCOUNTER — Telehealth: Payer: Self-pay | Admitting: Gynecologic Oncology

## 2014-08-17 NOTE — Progress Notes (Signed)
MRI expected date changed to 9/30 per Dr. Lindi Adie, to allow for 3 month from mammogram window.  LMOVM for Vivien Rota of change and to call if she needs further info.

## 2014-08-17 NOTE — Telephone Encounter (Signed)
Patient left letter for Dr. Fermin Schwab yesterday about concerns surrounding her previous bladder tacking.  Letter faxed to Dr. Judeth Porch at [facility].  Patient called this am with his recommendations of possible referral to a urogynecologist in [place] or she could go back and see her previous doc in [place].  She is to call with her decision.

## 2014-09-13 ENCOUNTER — Ambulatory Visit (HOSPITAL_BASED_OUTPATIENT_CLINIC_OR_DEPARTMENT_OTHER): Payer: BLUE CROSS/BLUE SHIELD

## 2014-09-13 ENCOUNTER — Other Ambulatory Visit (HOSPITAL_BASED_OUTPATIENT_CLINIC_OR_DEPARTMENT_OTHER): Payer: BLUE CROSS/BLUE SHIELD

## 2014-09-13 DIAGNOSIS — Z5111 Encounter for antineoplastic chemotherapy: Secondary | ICD-10-CM

## 2014-09-13 DIAGNOSIS — C541 Malignant neoplasm of endometrium: Secondary | ICD-10-CM

## 2014-09-13 LAB — COMPREHENSIVE METABOLIC PANEL (CC13)
ALT: 22 U/L (ref 0–55)
AST: 17 U/L (ref 5–34)
Albumin: 4.1 g/dL (ref 3.5–5.0)
Alkaline Phosphatase: 84 U/L (ref 40–150)
Anion Gap: 12 mEq/L — ABNORMAL HIGH (ref 3–11)
BUN: 18 mg/dL (ref 7.0–26.0)
CO2: 24 mEq/L (ref 22–29)
Calcium: 9.5 mg/dL (ref 8.4–10.4)
Chloride: 106 mEq/L (ref 98–109)
Creatinine: 0.8 mg/dL (ref 0.6–1.1)
EGFR: 83 mL/min/{1.73_m2} — ABNORMAL LOW (ref >90)
Glucose: 143 mg/dl — ABNORMAL HIGH (ref 70–140)
Potassium: 3.9 mEq/L (ref 3.5–5.1)
Sodium: 143 mEq/L (ref 136–145)
Total Bilirubin: 0.32 mg/dL (ref 0.20–1.20)
Total Protein: 6.9 g/dL (ref 6.4–8.3)

## 2014-09-13 LAB — CBC WITH DIFFERENTIAL/PLATELET
BASO%: 0.6 % (ref 0.0–2.0)
Basophils Absolute: 0 10*3/uL (ref 0.0–0.1)
EOS%: 2.9 % (ref 0.0–7.0)
Eosinophils Absolute: 0.2 10*3/uL (ref 0.0–0.5)
HCT: 42.1 % (ref 34.8–46.6)
HGB: 13.5 g/dL (ref 11.6–15.9)
LYMPH%: 32.7 % (ref 14.0–49.7)
MCH: 28.5 pg (ref 25.1–34.0)
MCHC: 32.1 g/dL (ref 31.5–36.0)
MCV: 88.9 fL (ref 79.5–101.0)
MONO#: 0.4 10*3/uL (ref 0.1–0.9)
MONO%: 5.5 % (ref 0.0–14.0)
NEUT#: 4.6 10*3/uL (ref 1.5–6.5)
NEUT%: 58.3 % (ref 38.4–76.8)
Platelets: 259 10*3/uL (ref 145–400)
RBC: 4.73 10*6/uL (ref 3.70–5.45)
RDW: 13 % (ref 11.2–14.5)
WBC: 8 10*3/uL (ref 3.9–10.3)
lymph#: 2.6 10*3/uL (ref 0.9–3.3)

## 2014-09-13 MED ORDER — FULVESTRANT 250 MG/5ML IM SOLN
500.0000 mg | Freq: Once | INTRAMUSCULAR | Status: AC
  Administered 2014-09-13: 500 mg via INTRAMUSCULAR
  Filled 2014-09-13: qty 10

## 2014-10-11 ENCOUNTER — Other Ambulatory Visit (HOSPITAL_BASED_OUTPATIENT_CLINIC_OR_DEPARTMENT_OTHER): Payer: BLUE CROSS/BLUE SHIELD

## 2014-10-11 ENCOUNTER — Ambulatory Visit (HOSPITAL_BASED_OUTPATIENT_CLINIC_OR_DEPARTMENT_OTHER): Payer: BLUE CROSS/BLUE SHIELD

## 2014-10-11 DIAGNOSIS — C541 Malignant neoplasm of endometrium: Secondary | ICD-10-CM

## 2014-10-11 DIAGNOSIS — Z5111 Encounter for antineoplastic chemotherapy: Secondary | ICD-10-CM

## 2014-10-11 LAB — CBC WITH DIFFERENTIAL/PLATELET
BASO%: 0.6 % (ref 0.0–2.0)
Basophils Absolute: 0 10*3/uL (ref 0.0–0.1)
EOS%: 2.2 % (ref 0.0–7.0)
Eosinophils Absolute: 0.2 10*3/uL (ref 0.0–0.5)
HCT: 40.4 % (ref 34.8–46.6)
HGB: 13.3 g/dL (ref 11.6–15.9)
LYMPH%: 37.6 % (ref 14.0–49.7)
MCH: 28.9 pg (ref 25.1–34.0)
MCHC: 32.8 g/dL (ref 31.5–36.0)
MCV: 88.3 fL (ref 79.5–101.0)
MONO#: 0.7 10*3/uL (ref 0.1–0.9)
MONO%: 7.9 % (ref 0.0–14.0)
NEUT#: 4.6 10*3/uL (ref 1.5–6.5)
NEUT%: 51.7 % (ref 38.4–76.8)
Platelets: 261 10*3/uL (ref 145–400)
RBC: 4.58 10*6/uL (ref 3.70–5.45)
RDW: 13.2 % (ref 11.2–14.5)
WBC: 8.9 10*3/uL (ref 3.9–10.3)
lymph#: 3.3 10*3/uL (ref 0.9–3.3)

## 2014-10-11 LAB — COMPREHENSIVE METABOLIC PANEL (CC13)
ALT: 18 U/L (ref 0–55)
AST: 17 U/L (ref 5–34)
Albumin: 4.1 g/dL (ref 3.5–5.0)
Alkaline Phosphatase: 90 U/L (ref 40–150)
Anion Gap: 10 mEq/L (ref 3–11)
BUN: 17.6 mg/dL (ref 7.0–26.0)
CO2: 24 mEq/L (ref 22–29)
Calcium: 9.3 mg/dL (ref 8.4–10.4)
Chloride: 106 mEq/L (ref 98–109)
Creatinine: 0.7 mg/dL (ref 0.6–1.1)
EGFR: >90 mL/min/{1.73_m2} (ref >90)
Glucose: 86 mg/dl (ref 70–140)
Potassium: 4.2 mEq/L (ref 3.5–5.1)
Sodium: 141 mEq/L (ref 136–145)
Total Bilirubin: 0.39 mg/dL (ref 0.20–1.20)
Total Protein: 6.9 g/dL (ref 6.4–8.3)

## 2014-10-11 MED ORDER — FULVESTRANT 250 MG/5ML IM SOLN
500.0000 mg | Freq: Once | INTRAMUSCULAR | Status: AC
  Administered 2014-10-11: 500 mg via INTRAMUSCULAR
  Filled 2014-10-11: qty 10

## 2014-11-05 ENCOUNTER — Other Ambulatory Visit: Payer: Self-pay | Admitting: Oncology

## 2014-11-08 ENCOUNTER — Other Ambulatory Visit (HOSPITAL_BASED_OUTPATIENT_CLINIC_OR_DEPARTMENT_OTHER): Payer: BLUE CROSS/BLUE SHIELD

## 2014-11-08 ENCOUNTER — Telehealth: Payer: Self-pay | Admitting: Oncology

## 2014-11-08 ENCOUNTER — Ambulatory Visit (HOSPITAL_BASED_OUTPATIENT_CLINIC_OR_DEPARTMENT_OTHER): Payer: BLUE CROSS/BLUE SHIELD

## 2014-11-08 ENCOUNTER — Ambulatory Visit: Payer: BLUE CROSS/BLUE SHIELD

## 2014-11-08 ENCOUNTER — Encounter: Payer: Self-pay | Admitting: Oncology

## 2014-11-08 ENCOUNTER — Ambulatory Visit (HOSPITAL_BASED_OUTPATIENT_CLINIC_OR_DEPARTMENT_OTHER): Payer: BLUE CROSS/BLUE SHIELD | Admitting: Oncology

## 2014-11-08 ENCOUNTER — Other Ambulatory Visit: Payer: BLUE CROSS/BLUE SHIELD

## 2014-11-08 VITALS — BP 142/78 | HR 65 | Temp 97.5°F | Resp 18 | Ht 64.75 in | Wt 147.4 lb

## 2014-11-08 DIAGNOSIS — C541 Malignant neoplasm of endometrium: Secondary | ICD-10-CM

## 2014-11-08 DIAGNOSIS — Z5111 Encounter for antineoplastic chemotherapy: Secondary | ICD-10-CM | POA: Diagnosis not present

## 2014-11-08 DIAGNOSIS — M810 Age-related osteoporosis without current pathological fracture: Secondary | ICD-10-CM | POA: Diagnosis not present

## 2014-11-08 DIAGNOSIS — Z1231 Encounter for screening mammogram for malignant neoplasm of breast: Secondary | ICD-10-CM

## 2014-11-08 DIAGNOSIS — R922 Inconclusive mammogram: Secondary | ICD-10-CM

## 2014-11-08 LAB — CBC WITH DIFFERENTIAL/PLATELET
BASO%: 0.2 % (ref 0.0–2.0)
Basophils Absolute: 0 10*3/uL (ref 0.0–0.1)
EOS%: 3.4 % (ref 0.0–7.0)
Eosinophils Absolute: 0.3 10*3/uL (ref 0.0–0.5)
HCT: 40.3 % (ref 34.8–46.6)
HGB: 13.5 g/dL (ref 11.6–15.9)
LYMPH%: 39.7 % (ref 14.0–49.7)
MCH: 29.7 pg (ref 25.1–34.0)
MCHC: 33.5 g/dL (ref 31.5–36.0)
MCV: 88.8 fL (ref 79.5–101.0)
MONO#: 0.7 10*3/uL (ref 0.1–0.9)
MONO%: 8 % (ref 0.0–14.0)
NEUT#: 4.4 10*3/uL (ref 1.5–6.5)
NEUT%: 48.7 % (ref 38.4–76.8)
Platelets: 229 10*3/uL (ref 145–400)
RBC: 4.54 10*6/uL (ref 3.70–5.45)
RDW: 13.1 % (ref 11.2–14.5)
WBC: 9.1 10*3/uL (ref 3.9–10.3)
lymph#: 3.6 10*3/uL — ABNORMAL HIGH (ref 0.9–3.3)

## 2014-11-08 LAB — COMPREHENSIVE METABOLIC PANEL (CC13)
ALT: 21 U/L (ref 0–55)
AST: 18 U/L (ref 5–34)
Albumin: 4.3 g/dL (ref 3.5–5.0)
Alkaline Phosphatase: 73 U/L (ref 40–150)
Anion Gap: 12 mEq/L — ABNORMAL HIGH (ref 3–11)
BUN: 19.3 mg/dL (ref 7.0–26.0)
CO2: 25 mEq/L (ref 22–29)
Calcium: 9.8 mg/dL (ref 8.4–10.4)
Chloride: 105 mEq/L (ref 98–109)
Creatinine: 0.7 mg/dL (ref 0.6–1.1)
EGFR: >90 mL/min/{1.73_m2} (ref >90)
Glucose: 83 mg/dl (ref 70–140)
Potassium: 4.2 mEq/L (ref 3.5–5.1)
Sodium: 143 mEq/L (ref 136–145)
Total Bilirubin: 0.42 mg/dL (ref 0.20–1.20)
Total Protein: 6.9 g/dL (ref 6.4–8.3)

## 2014-11-08 MED ORDER — FULVESTRANT 250 MG/5ML IM SOLN
500.0000 mg | Freq: Once | INTRAMUSCULAR | Status: AC
  Administered 2014-11-08: 500 mg via INTRAMUSCULAR
  Filled 2014-11-08: qty 10

## 2014-11-08 NOTE — Telephone Encounter (Signed)
Patients appointments made and avs printed for patient,inbox to dr Marko Plume for [facility] order and patient will receive her letter to schedule

## 2014-11-08 NOTE — Progress Notes (Signed)
OFFICE PROGRESS NOTE   November 08, 2014   Physicians:Daniel ClarkePearson, Knox Saliva ([facility]), Verl Blalock (rheumatology, [place]), Leda Roys (PCP, [facility]), Suzan Garibaldi (urologic surgeon, [facility]), (J.Matthew McDonald gyn onc Baldo Ash), [address] (GI, [place]), _ [facility], Baldo Ash)  INTERVAL HISTORY:   Patient is seen, alone for visit, as she continues monthly Faslodex for recurrent low grade endometrial stromal sarcoma; Faslodex was begun 06-2013 at recommendation of Dr Knox Saliva at [facility]. Most recent MRI AP was done in [facility] system 06-09-14. She saw Dr Mauro Kaufmann after that MRI, and is to see Dr Josephina Shih 12-09-14. She continues to tolerate Faslodex well.  She is also considered high risk for breast cancer, with with personal history atypia in breast biopsy 1998 and dense breast tissue,  mother and sister with lobular breast cancers, another sister at high risk. She saw Dr Lindi Adie for high risk breast counseling and will have breast MRI within 3 months of next mammograms, which are due at [facility] in [place] early July 2016. It seems likely that the Faslodex may give some protection for breast cancer risk.   Patient has no new or different complaints that seem referable to the endometrial stromal sarcoma history or ongoing treatment. Bowels are moving regularly with daily miralax, which has resolved other GI complaints. She has no concerning abdominal or pelvic symptoms. She uses NSAID prior to Faslodex injections and rests for a few hours afterwards, has had no problems with injection sites.  No PAC Flu vaccine done Genetics testing: CancerNext panel from 05-2014 with MSH2 VUS  .  ONCOLOGIC HISTORY History is of abdominal pain early 2010, with CT March 2010 with 8 cm intrauterine mass consistent with fibroid, and preop endometrial biopsy and PAP normal. 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 That pathology ([id number] from 12-03-2008) was reviewed at [facility] 01-31-2009, the [facility] report to be scanned into this EMR: low grade endometrial stromal sarcoma 9.5 cm, confined to uterine corpus, + LVSI, 0/19 nodes involved, benign bilateral ovaries and tubes, benign cervix; "per outside report, the tumor is diffusely and strongly positive for estrogen receptor and progesterone receptor".  She had severe hot flashes after surgery/ prior to beginning aromatase inhibitor. She was seen in consultation by Dr Carlton Adam began [employer] 02-2009, which she tolerated poorly due to progressive joint aches, worse hot flashes and related insomnia; she DCd Femara in 12-2009 due to those symptoms. She tried Aromasin from 01-2010 thru 03-2010, also unable to tolerate "due to insomnia". She briefly resumed Femara at 1/2 prior dose. From 04-2012 thru 02-2013 she took raloxifene, prescribed by rheumatologist for low bone density. She has been followed with MRI scans (all at [facility]) and exams by Dr Josephina Shih now every 3 months.  Patient developed symptomatic vaginal vault prolapse, with robotically assisted laparoscopic vaginal vault suspension by Dr Suzan Garibaldi, urogynecology at [facility] in [place], on 04-22-2013. At time of surgery, she had "5 mm nodules of recurrent low grade endometrial stromal sarcoma" resected. (Operative note and that path not available in present records, however path was reviewed at [facility], consistent with low grade endometrial stromal sarcoma). She was seen for second opinion consultation at [facility] by Dr Chilton Greathouse on 05-22-13 (consult note copied from patient's records now and will be scanned into EMR). The consultation note suggests that all of this incidentally found recurrence was resected at time  of the urogynecology procedure; recommendation was for monthly Faslodex. She had #3 Faslodex at [facility]  09-10-2013 and has continued monthly injections at [facility] since 10-08-2013. MRI AP 12-21-13 had no evidence of disease. MRI AP 06-09-14 likewise no evidence of disease.    Review of systems as above, also: Sinus infection several weeks ago resolved with antibiotic course. Arthritis symptoms feet and hips x one month, previously followed by rheumatologist in [place] on meds x 3 years + orthotics. Had Reclast in [place] 09-28-14. Remainder of 10 point Review of Systems negative.  Objective:  Vital signs in last 24 hours:  BP 142/78 mmHg  Pulse 65  Temp(Src) 97.5 F (36.4 C) (Oral)  Resp 18  Ht 5' 4.75" (1.645 m)  Wt 147 lb 6.4 oz (66.86 kg)  BMI 24.71 kg/m2  SpO2 100% Weight up 3 lbs Alert, oriented and appropriate. Ambulatory without difficulty.   HEENT:PERRL, sclerae not icteric. Oral mucosa moist without lesions, posterior pharynx clear.  Neck supple. No JVD.  Lymphatics:no cervical,supraclavicular, axillary or inguinal adenopathy Resp: clear to auscultation bilaterally and normal percussion bilaterally Cardio: regular rate and rhythm. No gallop. GI: soft, nontender, not distended, no mass or organomegaly. Normally active bowel sounds. Surgical incision not remarkable. Musculoskeletal/ Extremities: without pitting edema, cords, tenderness Neuro: no peripheral neuropathy. Otherwise nonfocal. PSYCH appropriate mood and affect Skin without rash, ecchymosis, petechiae Breasts: bilaterally without dominant mass, skin or nipple findings. Axillae benign.   Lab Results:  Results for orders placed or performed in visit on 11/08/14  CBC with Differential  Result Value Ref Range   WBC 9.1 3.9 - 10.3 10e3/uL   NEUT# 4.4 1.5 - 6.5 10e3/uL   HGB 13.5 11.6 - 15.9 g/dL   HCT 40.3 34.8 - 46.6 %   Platelets 229 145 - 400 10e3/uL   MCV 88.8 79.5 - 101.0 fL   MCH 29.7 25.1 - 34.0 pg   MCHC 33.5 31.5 - 36.0 g/dL   RBC 4.54 3.70 - 5.45 10e6/uL   RDW 13.1 11.2 - 14.5 %   lymph# 3.6 (H) 0.9 -  3.3 10e3/uL   MONO# 0.7 0.1 - 0.9 10e3/uL   Eosinophils Absolute 0.3 0.0 - 0.5 10e3/uL   Basophils Absolute 0.0 0.0 - 0.1 10e3/uL   NEUT% 48.7 38.4 - 76.8 %   LYMPH% 39.7 14.0 - 49.7 %   MONO% 8.0 0.0 - 14.0 %   EOS% 3.4 0.0 - 7.0 %   BASO% 0.2 0.0 - 2.0 %   CMET today  normal  Studies/Results:  For MRI CAP 12-01-14 prior to seeing Dr Josephina Shih on 12-09-14  Bilateral tomo mammograms ordered at [facility] ~ 02-04-15 (last 02-02-14); she will have breast MRI late Sept.  Medications: I have reviewed the patient's current medications. Reclast done in [place] 09-28-14. Continue Faslodex monthly as long as no changes based on upcoming body MRI and Dr Tamela Oddi visit.  DISCUSSION: patient is aware of imaging as above and in agreement with recommendations and plans.   Assessment/Plan:  1.low grade endometrial stromal sarcoma: history as above, incidentally found recurrent at urogynecologic procedure 04-2013, now on monthly Faslodex since ~ Nov 2014 per recommendation from [facility]. Follow up MRI CAP late April as scheduled, Dr Josephina Shih early May. I will see her back coordinating with Faslodex dates and other MDs. 2.history of intraductal papilloma left breast with atypia 1998 and personal dense breast tissue, mother and sister with lobular breast cancers, another sister with high risk for breast cancer. Breast MRI within 3 months  after early July tomo mammograms 3.urologic symptoms intermittently with known cystocoele and previous bladder tuck 4.osteoporosis: Reclast given 09-28-14, on calcium with D and supplemental D3, now doing regular exercise  5.deafness right ear since childhood  6.hx basal cell skin ca  7.multiple miscarriages  8.osteoarthritis  9. Arthralgias from aromatase inhibitors, resolved 10. 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  Faslodex orders confirmed, scheduling as above. She knows that she can call prior to next scheduled appointment if needed. Time spent 25 min including >50% counseling and coordination of care.    LIVESAY,LENNIS P, MD   11/08/2014, 10:04 AM

## 2014-11-26 ENCOUNTER — Encounter (HOSPITAL_COMMUNITY): Payer: Self-pay

## 2014-12-01 ENCOUNTER — Ambulatory Visit (HOSPITAL_COMMUNITY)
Admission: RE | Admit: 2014-12-01 | Discharge: 2014-12-01 | Disposition: A | Discharge status: Home / Self Care | Payer: BLUE CROSS/BLUE SHIELD | Source: Ambulatory Visit | Attending: Gynecologic Oncology | Admitting: Gynecologic Oncology

## 2014-12-01 ENCOUNTER — Ambulatory Visit (HOSPITAL_COMMUNITY): Admission: RE | Admit: 2014-12-01 | Payer: BLUE CROSS/BLUE SHIELD | Source: Ambulatory Visit

## 2014-12-01 DIAGNOSIS — C541 Malignant neoplasm of endometrium: Secondary | ICD-10-CM

## 2014-12-01 DIAGNOSIS — D390 Neoplasm of uncertain behavior of uterus: Secondary | ICD-10-CM | POA: Insufficient documentation

## 2014-12-01 MED ORDER — GADOBENATE DIMEGLUMINE 529 MG/ML IV SOLN
15.0000 mL | Freq: Once | INTRAVENOUS | Status: AC
  Administered 2014-12-01: 13 mL via INTRAVENOUS

## 2014-12-06 ENCOUNTER — Ambulatory Visit: Payer: BLUE CROSS/BLUE SHIELD

## 2014-12-06 ENCOUNTER — Other Ambulatory Visit: Payer: BLUE CROSS/BLUE SHIELD

## 2014-12-09 ENCOUNTER — Ambulatory Visit (HOSPITAL_BASED_OUTPATIENT_CLINIC_OR_DEPARTMENT_OTHER): Payer: BLUE CROSS/BLUE SHIELD

## 2014-12-09 ENCOUNTER — Ambulatory Visit: Payer: BLUE CROSS/BLUE SHIELD | Attending: Gynecology | Admitting: Gynecology

## 2014-12-09 ENCOUNTER — Encounter: Payer: Self-pay | Admitting: Gynecology

## 2014-12-09 ENCOUNTER — Other Ambulatory Visit (HOSPITAL_BASED_OUTPATIENT_CLINIC_OR_DEPARTMENT_OTHER): Payer: BLUE CROSS/BLUE SHIELD

## 2014-12-09 ENCOUNTER — Other Ambulatory Visit: Payer: Self-pay | Admitting: Oncology

## 2014-12-09 VITALS — BP 141/80 | HR 70 | Temp 98.2°F | Resp 18 | Ht 64.0 in | Wt 145.2 lb

## 2014-12-09 VITALS — BP 152/74 | HR 74 | Temp 97.7°F

## 2014-12-09 DIAGNOSIS — Z5111 Encounter for antineoplastic chemotherapy: Secondary | ICD-10-CM

## 2014-12-09 DIAGNOSIS — Z90710 Acquired absence of both cervix and uterus: Secondary | ICD-10-CM

## 2014-12-09 DIAGNOSIS — Z8542 Personal history of malignant neoplasm of other parts of uterus: Secondary | ICD-10-CM | POA: Diagnosis not present

## 2014-12-09 DIAGNOSIS — C541 Malignant neoplasm of endometrium: Secondary | ICD-10-CM | POA: Diagnosis not present

## 2014-12-09 DIAGNOSIS — N8110 Cystocele, unspecified: Secondary | ICD-10-CM

## 2014-12-09 DIAGNOSIS — Z803 Family history of malignant neoplasm of breast: Secondary | ICD-10-CM

## 2014-12-09 DIAGNOSIS — D390 Neoplasm of uncertain behavior of uterus: Secondary | ICD-10-CM | POA: Insufficient documentation

## 2014-12-09 DIAGNOSIS — Z1231 Encounter for screening mammogram for malignant neoplasm of breast: Secondary | ICD-10-CM

## 2014-12-09 LAB — COMPREHENSIVE METABOLIC PANEL (CC13)
ALT: 25 U/L (ref 0–55)
AST: 21 U/L (ref 5–34)
Albumin: 4.4 g/dL (ref 3.5–5.0)
Alkaline Phosphatase: 71 U/L (ref 40–150)
Anion Gap: 13 mEq/L — ABNORMAL HIGH (ref 3–11)
BUN: 17.2 mg/dL (ref 7.0–26.0)
CO2: 26 mEq/L (ref 22–29)
Calcium: 10.1 mg/dL (ref 8.4–10.4)
Chloride: 103 mEq/L (ref 98–109)
Creatinine: 0.7 mg/dL (ref 0.6–1.1)
EGFR: >90 mL/min/{1.73_m2} (ref >90)
Glucose: 107 mg/dl (ref 70–140)
Potassium: 4.5 mEq/L (ref 3.5–5.1)
Sodium: 142 mEq/L (ref 136–145)
Total Bilirubin: 0.44 mg/dL (ref 0.20–1.20)
Total Protein: 7.5 g/dL (ref 6.4–8.3)

## 2014-12-09 LAB — CBC WITH DIFFERENTIAL/PLATELET
BASO%: 0.5 % (ref 0.0–2.0)
Basophils Absolute: 0 10*3/uL (ref 0.0–0.1)
EOS%: 1.9 % (ref 0.0–7.0)
Eosinophils Absolute: 0.2 10*3/uL (ref 0.0–0.5)
HCT: 42.1 % (ref 34.8–46.6)
HGB: 14.1 g/dL (ref 11.6–15.9)
LYMPH%: 36.3 % (ref 14.0–49.7)
MCH: 29.9 pg (ref 25.1–34.0)
MCHC: 33.5 g/dL (ref 31.5–36.0)
MCV: 89.4 fL (ref 79.5–101.0)
MONO#: 0.6 10*3/uL (ref 0.1–0.9)
MONO%: 6.7 % (ref 0.0–14.0)
NEUT#: 4.8 10*3/uL (ref 1.5–6.5)
NEUT%: 54.6 % (ref 38.4–76.8)
Platelets: 246 10*3/uL (ref 145–400)
RBC: 4.71 10*6/uL (ref 3.70–5.45)
RDW: 12.9 % (ref 11.2–14.5)
WBC: 8.8 10*3/uL (ref 3.9–10.3)
lymph#: 3.2 10*3/uL (ref 0.9–3.3)

## 2014-12-09 MED ORDER — FULVESTRANT 250 MG/5ML IM SOLN
500.0000 mg | Freq: Once | INTRAMUSCULAR | Status: AC
  Administered 2014-12-09: 500 mg via INTRAMUSCULAR
  Filled 2014-12-09: qty 10

## 2014-12-09 NOTE — Patient Instructions (Addendum)
Return to see his in 6 months. We will schedule an MRI for the end of October.  Please call for any questions or concerns.

## 2014-12-09 NOTE — Patient Instructions (Signed)
Fulvestrant injection  What is this medicine?  FULVESTRANT (ful VES trant) blocks the effects of estrogen. It is used to treat breast cancer in women past the age of menopause.  This medicine may be used for other purposes; ask your health care provider or pharmacist if you have questions.  COMMON BRAND NAME(S): FASLODEX  What should I tell my health care provider before I take this medicine?  They need to know if you have any of these conditions:  -bleeding problems  -liver disease  -low levels of platelets in the blood  -an unusual or allergic reaction to fulvestrant, other medicines, foods, dyes, or preservatives  -pregnant or trying to get pregnant  -breast-feeding  How should I use this medicine?  This medicine is for injection into a muscle. It is usually given by a health care professional in a hospital or clinic setting.  Talk to your pediatrician regarding the use of this medicine in children. Special care may be needed.  Overdosage: If you think you have taken too much of this medicine contact a poison control center or emergency room at once.  NOTE: This medicine is only for you. Do not share this medicine with others.  What if I miss a dose?  It is important not to miss your dose. Call your doctor or health care professional if you are unable to keep an appointment.  What may interact with this medicine?  -medicines that treat or prevent blood clots like warfarin, enoxaparin, and dalteparin  This list may not describe all possible interactions. Give your health care provider a list of all the medicines, herbs, non-prescription drugs, or dietary supplements you use. Also tell them if you smoke, drink alcohol, or use illegal drugs. Some items may interact with your medicine.  What should I watch for while using this medicine?  Your condition will be monitored carefully while you are receiving this medicine. You will need important blood work done while you are taking this medicine.  Do not become pregnant  while taking this medicine. Women should inform their doctor if they wish to become pregnant or think they might be pregnant. There is a potential for serious side effects to an unborn child. Talk to your health care professional or pharmacist for more information.  What side effects may I notice from receiving this medicine?  Side effects that you should report to your doctor or health care professional as soon as possible:  -allergic reactions like skin rash, itching or hives, swelling of the face, lips, or tongue  -feeling faint or lightheaded, falls  -fever or flu-like symptoms  -sore throat  -vaginal bleeding  Side effects that usually do not require medical attention (report to your doctor or health care professional if they continue or are bothersome):  -aches, pains  -constipation or diarrhea  -headache  -hot flashes  -nausea, vomiting  -pain at site where injected  -stomach pain  This list may not describe all possible side effects. Call your doctor for medical advice about side effects. You may report side effects to FDA at 1-800-FDA-1088.  Where should I keep my medicine?  This drug is given in a hospital or clinic and will not be stored at home.  NOTE: This sheet is a summary. It may not cover all possible information. If you have questions about this medicine, talk to your doctor, pharmacist, or health care provider.   2015, Elsevier/Gold Standard. (2007-12-01 15:39:24)

## 2014-12-09 NOTE — Progress Notes (Signed)
Consult Note: Gyn-Onc   Karla Watts 59 y.o. female  Chief Complaint  Patient presents with  . low grade endometrial stromal sarcoma of uterus    Assessment : Recurrent low-grade endometrial stromal sarcoma. Clinically free of disease. Cystocele which is relatively asymptomatic.  Plan: We will continue Faslodex 500 mg IM every 4 weeks. She returned to see me in 6 months and we will obtain an MRI shortly prior to that visit.  The patient is planning follow-up with her gynecologist in [place] regarding the cystocele.  Interval History: The patient returns today for continuing followup of low-grade endometrial stromal sarcoma. A recurrence was discovered in September 2014. Subsequently the patient's been on Faslodex 500 mg IM every 4 weeks. She was seen at [facility] in December 2015 and encouraged to continue taking Faslodex   On 12/01/2014 she had an MRI of the abdomen and pelvis which is entirely negative. She denies any abdominal or GI symptoms. Functional status is excellent. She has some symptoms of protrusion from her cystocele and minimal urinary incontinence. She is recently had some discomfort in her posterior vaginal wall. She denies any vaginal bleeding.     HPI: Patient developed abdominal pain and an 8 cm intrauterine mass in March of 2010. This is thought to be a fibroid. Preoperative endometrial biopsy and Pap smear were normal. She underwent a total abdominal hysterectomy bilateral salpingo-oophorectomy and staging procedures by a gynecologic oncologist. Final pathology showed a low-grade endometrial stromal sarcoma. She received a short course of Femara but did not tolerate it well therefore discontinued therapy. She was then followed until September 2014 with a recurrence was found in the pelvic peritoneum at the time of robotic assisted vaginal vault suspension. The patient sought second opinion consultation at [facility] with the  sarcoma service. Multiple options were recommended for adjuvant treatment and subsequently the patient chose be treated with Faslodex.  Review of Systems:10 point review of systems is negative except as noted in interval history.   Vitals: Blood pressure 141/80, pulse 70, temperature 98.2 F (36.8 C), temperature source Oral, resp. rate 18, height 5\' 4"  (1.626 m), weight 145 lb 3.2 oz (65.862 kg).  Physical Exam: General : The patient is a healthy woman in no acute distress.  HEENT: normocephalic, extraoccular movements normal; neck is supple without thyromegally  Lynphnodes: Supraclavicular and inguinal nodes not enlarged  Abdomen: Soft, non-tender, no ascites, no organomegally, no masses, no hernias  Pelvic:  EGBUS: Normal female  Vagina: Normal, no lesions , she does have a moderate cystocele extending to the introitus. The apex of the vagina is well supported.Marland Kitchen Urethra and Bladder: Normal, non-tender  Cervix: Surgically absent  Uterus: Surgically absent  Bi-manual examination: Non-tender; no adenxal masses or nodularity  Rectal: normal sphincter tone, no masses, no blood  Lower extremities: No edema or varicosities. Normal range of motion      Allergies  Allergen Reactions  . Ciprofloxacin Swelling  . Morphine And Related Nausea And Vomiting  . Penicillins Nausea Only  . Sulfur Hives    Past Medical History  Diagnosis Date  . Osteoporosis   . Allergy   . Uterine cancer 2010    Endometrial Stromal Sarcoma    Past Surgical History  Procedure Laterality Date  . Abdominal hysterectomy      Current Outpatient Prescriptions  Medication Sig Dispense Refill  . bisacodyl (DULCOLAX) 5 MG EC tablet Take 5 mg by mouth daily as needed for moderate constipation.    Marland Kitchen  Calcium-Vitamin D-Vitamin K (VIACTIV) [phone] MG-UNT-MCG CHEW Chew 1 tablet by mouth daily.    . cetirizine (ZYRTEC) 10 MG tablet Take 10 mg by mouth daily as needed for allergies.    . Cholecalciferol  (VITAMIN D3) 1000 UNITS CAPS Take 1 capsule by mouth daily.    . clotrimazole-betamethasone (LOTRISONE) cream   0  . cyclobenzaprine (FLEXERIL) 10 MG tablet Take 5 mg by mouth at bedtime as needed.    . diclofenac (VOLTAREN) 75 MG EC tablet Take 75 mg by mouth 2 (two) times daily.   0  . fulvestrant (FASLODEX) 250 MG/5ML injection Inject 500 mg into the muscle every 30 (thirty) days. One injection each buttock over 1-2 minutes. Warm prior to use.    . Multiple Vitamin (MULTIVITAMIN) capsule Take 1 capsule by mouth daily.    . polyethylene glycol (MIRALAX / GLYCOLAX) packet Take 17 g by mouth daily.     . Simethicone (GAS-X PO) Take 1 tablet by mouth as needed.    . zoledronic acid (RECLAST) 5 MG/100ML SOLN injection Inject 5 mg into the vein once. Pt received this medication on Feb.23,2016. Due annually.     No current facility-administered medications for this visit.    History   Social History  . Marital Status: Married    Spouse Name: N/A  . Number of Children: N/A  . Years of Education: N/A   Occupational History  . Not on file.   Social History Main Topics  . Smoking status: Never Smoker   . Smokeless tobacco: Not on file  . Alcohol Use: Yes     Comment: 1-2 per week  . Drug Use: No  . Sexual Activity: Not on file   Other Topics Concern  . Not on file   Social History Narrative    Family History  Problem Relation Age of Onset  . Breast cancer Mother 72    lobular breast cancer  . Lymphoma Father 38  . Breast cancer Sister 33    lobular breast cancer  . Stroke Maternal Grandfather   . Leukemia Paternal Grandmother     dx in her 61s  . Heart attack Paternal Lindajo Royal, MD 12/09/2014, 9:50 AM

## 2014-12-13 ENCOUNTER — Telehealth: Payer: Self-pay | Admitting: Oncology

## 2014-12-13 NOTE — Telephone Encounter (Signed)
Pt called in to r/s labs/inj will be out of town.... KJ

## 2014-12-30 ENCOUNTER — Other Ambulatory Visit: Payer: Self-pay | Admitting: *Deleted

## 2014-12-30 DIAGNOSIS — C55 Malignant neoplasm of uterus, part unspecified: Secondary | ICD-10-CM

## 2015-01-04 ENCOUNTER — Other Ambulatory Visit: Payer: BLUE CROSS/BLUE SHIELD

## 2015-01-04 ENCOUNTER — Other Ambulatory Visit (HOSPITAL_BASED_OUTPATIENT_CLINIC_OR_DEPARTMENT_OTHER): Payer: BLUE CROSS/BLUE SHIELD

## 2015-01-04 ENCOUNTER — Ambulatory Visit: Payer: BLUE CROSS/BLUE SHIELD

## 2015-01-04 ENCOUNTER — Ambulatory Visit (HOSPITAL_BASED_OUTPATIENT_CLINIC_OR_DEPARTMENT_OTHER): Payer: BLUE CROSS/BLUE SHIELD

## 2015-01-04 VITALS — BP 139/77 | HR 78 | Temp 98.1°F

## 2015-01-04 DIAGNOSIS — C541 Malignant neoplasm of endometrium: Secondary | ICD-10-CM

## 2015-01-04 DIAGNOSIS — Z5111 Encounter for antineoplastic chemotherapy: Secondary | ICD-10-CM | POA: Diagnosis not present

## 2015-01-04 DIAGNOSIS — C55 Malignant neoplasm of uterus, part unspecified: Secondary | ICD-10-CM

## 2015-01-04 LAB — CBC WITH DIFFERENTIAL/PLATELET
BASO%: 0.2 % (ref 0.0–2.0)
Basophils Absolute: 0 10*3/uL (ref 0.0–0.1)
EOS%: 2.5 % (ref 0.0–7.0)
Eosinophils Absolute: 0.2 10*3/uL (ref 0.0–0.5)
HCT: 39.2 % (ref 34.8–46.6)
HGB: 13.1 g/dL (ref 11.6–15.9)
LYMPH%: 39.3 % (ref 14.0–49.7)
MCH: 29.8 pg (ref 25.1–34.0)
MCHC: 33.4 g/dL (ref 31.5–36.0)
MCV: 89.3 fL (ref 79.5–101.0)
MONO#: 0.8 10*3/uL (ref 0.1–0.9)
MONO%: 8.6 % (ref 0.0–14.0)
NEUT#: 4.6 10*3/uL (ref 1.5–6.5)
NEUT%: 49.4 % (ref 38.4–76.8)
Platelets: 252 10*3/uL (ref 145–400)
RBC: 4.39 10*6/uL (ref 3.70–5.45)
RDW: 13.1 % (ref 11.2–14.5)
WBC: 9.3 10*3/uL (ref 3.9–10.3)
lymph#: 3.7 10*3/uL — ABNORMAL HIGH (ref 0.9–3.3)

## 2015-01-04 LAB — COMPREHENSIVE METABOLIC PANEL (CC13)
ALT: 33 U/L (ref 0–55)
AST: 23 U/L (ref 5–34)
Albumin: 4.1 g/dL (ref 3.5–5.0)
Alkaline Phosphatase: 64 U/L (ref 40–150)
Anion Gap: 8 mEq/L (ref 3–11)
BUN: 14.1 mg/dL (ref 7.0–26.0)
CO2: 28 mEq/L (ref 22–29)
Calcium: 9.7 mg/dL (ref 8.4–10.4)
Chloride: 106 mEq/L (ref 98–109)
Creatinine: 0.7 mg/dL (ref 0.6–1.1)
EGFR: >90 mL/min/{1.73_m2} (ref >90)
Glucose: 91 mg/dl (ref 70–140)
Potassium: 4.3 mEq/L (ref 3.5–5.1)
Sodium: 142 mEq/L (ref 136–145)
Total Bilirubin: 0.35 mg/dL (ref 0.20–1.20)
Total Protein: 7.1 g/dL (ref 6.4–8.3)

## 2015-01-04 MED ORDER — FULVESTRANT 250 MG/5ML IM SOLN
500.0000 mg | Freq: Once | INTRAMUSCULAR | Status: AC
  Administered 2015-01-04: 500 mg via INTRAMUSCULAR
  Filled 2015-01-04: qty 10

## 2015-01-06 ENCOUNTER — Ambulatory Visit: Payer: BLUE CROSS/BLUE SHIELD

## 2015-01-06 ENCOUNTER — Ambulatory Visit: Payer: BLUE CROSS/BLUE SHIELD | Admitting: Oncology

## 2015-01-06 ENCOUNTER — Other Ambulatory Visit: Payer: BLUE CROSS/BLUE SHIELD

## 2015-02-02 ENCOUNTER — Other Ambulatory Visit: Payer: Self-pay | Admitting: Oncology

## 2015-02-02 ENCOUNTER — Other Ambulatory Visit: Payer: Self-pay | Admitting: *Deleted

## 2015-02-02 DIAGNOSIS — C541 Malignant neoplasm of endometrium: Secondary | ICD-10-CM

## 2015-02-03 ENCOUNTER — Other Ambulatory Visit (HOSPITAL_BASED_OUTPATIENT_CLINIC_OR_DEPARTMENT_OTHER): Payer: BLUE CROSS/BLUE SHIELD

## 2015-02-03 ENCOUNTER — Ambulatory Visit
Admission: RE | Admit: 2015-02-03 | Discharge: 2015-02-03 | Disposition: A | Discharge status: Home / Self Care | Payer: BLUE CROSS/BLUE SHIELD | Source: Ambulatory Visit | Attending: Oncology | Admitting: Oncology

## 2015-02-03 ENCOUNTER — Ambulatory Visit (HOSPITAL_BASED_OUTPATIENT_CLINIC_OR_DEPARTMENT_OTHER): Payer: BLUE CROSS/BLUE SHIELD

## 2015-02-03 ENCOUNTER — Encounter: Payer: Self-pay | Admitting: Oncology

## 2015-02-03 ENCOUNTER — Telehealth: Payer: Self-pay | Admitting: Oncology

## 2015-02-03 ENCOUNTER — Ambulatory Visit (HOSPITAL_BASED_OUTPATIENT_CLINIC_OR_DEPARTMENT_OTHER): Payer: BLUE CROSS/BLUE SHIELD | Admitting: Oncology

## 2015-02-03 VITALS — BP 140/87 | HR 71 | Temp 97.5°F | Resp 18 | Ht 64.0 in | Wt 146.6 lb

## 2015-02-03 DIAGNOSIS — R922 Inconclusive mammogram: Secondary | ICD-10-CM | POA: Diagnosis not present

## 2015-02-03 DIAGNOSIS — M899 Disorder of bone, unspecified: Secondary | ICD-10-CM | POA: Diagnosis not present

## 2015-02-03 DIAGNOSIS — D390 Neoplasm of uncertain behavior of uterus: Secondary | ICD-10-CM

## 2015-02-03 DIAGNOSIS — Z803 Family history of malignant neoplasm of breast: Secondary | ICD-10-CM

## 2015-02-03 DIAGNOSIS — C541 Malignant neoplasm of endometrium: Secondary | ICD-10-CM

## 2015-02-03 DIAGNOSIS — Z5111 Encounter for antineoplastic chemotherapy: Secondary | ICD-10-CM | POA: Diagnosis not present

## 2015-02-03 DIAGNOSIS — Z1231 Encounter for screening mammogram for malignant neoplasm of breast: Secondary | ICD-10-CM

## 2015-02-03 LAB — COMPREHENSIVE METABOLIC PANEL (CC13)
ALT: 40 U/L (ref 0–55)
AST: 28 U/L (ref 5–34)
Albumin: 4.2 g/dL (ref 3.5–5.0)
Alkaline Phosphatase: 73 U/L (ref 40–150)
Anion Gap: 9 mEq/L (ref 3–11)
BUN: 19.7 mg/dL (ref 7.0–26.0)
CO2: 25 mEq/L (ref 22–29)
Calcium: 9.8 mg/dL (ref 8.4–10.4)
Chloride: 107 mEq/L (ref 98–109)
Creatinine: 0.7 mg/dL (ref 0.6–1.1)
EGFR: >90 mL/min/{1.73_m2} (ref >90)
Glucose: 87 mg/dl (ref 70–140)
Potassium: 4.1 mEq/L (ref 3.5–5.1)
Sodium: 141 mEq/L (ref 136–145)
Total Bilirubin: 0.44 mg/dL (ref 0.20–1.20)
Total Protein: 7.3 g/dL (ref 6.4–8.3)

## 2015-02-03 LAB — CBC WITH DIFFERENTIAL/PLATELET
BASO%: 0.6 % (ref 0.0–2.0)
Basophils Absolute: 0.1 10*3/uL (ref 0.0–0.1)
EOS%: 2.3 % (ref 0.0–7.0)
Eosinophils Absolute: 0.2 10*3/uL (ref 0.0–0.5)
HCT: 40.4 % (ref 34.8–46.6)
HGB: 13.6 g/dL (ref 11.6–15.9)
LYMPH%: 37.2 % (ref 14.0–49.7)
MCH: 29.7 pg (ref 25.1–34.0)
MCHC: 33.6 g/dL (ref 31.5–36.0)
MCV: 88.3 fL (ref 79.5–101.0)
MONO#: 0.7 10*3/uL (ref 0.1–0.9)
MONO%: 7.3 % (ref 0.0–14.0)
NEUT#: 5.1 10*3/uL (ref 1.5–6.5)
NEUT%: 52.6 % (ref 38.4–76.8)
Platelets: 265 10*3/uL (ref 145–400)
RBC: 4.57 10*6/uL (ref 3.70–5.45)
RDW: 12.9 % (ref 11.2–14.5)
WBC: 9.6 10*3/uL (ref 3.9–10.3)
lymph#: 3.6 10*3/uL — ABNORMAL HIGH (ref 0.9–3.3)

## 2015-02-03 MED ORDER — FULVESTRANT 250 MG/5ML IM SOLN
500.0000 mg | Freq: Once | INTRAMUSCULAR | Status: AC
  Administered 2015-02-03: 500 mg via INTRAMUSCULAR
  Filled 2015-02-03: qty 10

## 2015-02-03 NOTE — Progress Notes (Signed)
OFFICE PROGRESS NOTE   February 03, 2015   Physicians:Daniel ClarkePearson, Knox Saliva ([facility]), Verl Blalock (rheumatology, [place]), Leda Roys (PCP, [facility]), Suzan Garibaldi (urologic surgeon, [facility]), (J.Matthew McDonald gyn onc Baldo Ash), [address] (GI, [place]), _ [facility]rology, Baldo Ash)  INTERVAL HISTORY:  Patient is seen, alone for visit, in continuing attention to monthly Faslodex used for recurrent low grade endometrial stromal sarcoma. She has been on Faslodex since 06-2013, at recommendation of Dr Knox Saliva at [facility]; she will see Dr Josephina Shih in early Nov.  She is also considered high risk for breast cancer, with with personal history atypia in breast biopsy 1998 and dense breast tissue, mother and sister with lobular breast cancers, another sister at high risk. She saw Dr Lindi Adie for high risk breast counseling and will have breast MRI within 3 months of 3D mammograms that are to be done at [facility] later today.  Patient continues to tolerate the Faslodex generally well, tho for past couple of treatments she notices increased fatigue beginnin 1-2 hrs after injection and lasting rest of that day before resolving. The fatigue day of injection has made it difficult for her to drive back to home in [place] area, so that she plans to stay with family in [place].   She saw a different gynecologist in [place] recently, with recommendation for coconut oil to vagina and perineum, and for pelvic PT. The coconut oil "burned" where skin was irritated, and pelvic PT was uncomfortable including some slight bleeding after manipulation, such that she discontinued the PT. She has not used olive oil, but will try that. She denies pelvic or abdominal discomfort. Bowels are moving well daily with daily miralax.   She saw urologist recently, unable to do any further mesh repair of cystocoele   No PAC Flu vaccine  done Genetics testing: CancerNext panel from 05-2014 with MSH2 VUS  .  ONCOLOGIC HISTORY History is of abdominal pain early 2010, with CT March 2010 with 8 cm intrauterine mass consistent with fibroid, and preop endometrial biopsy and PAP normal. 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 That pathology ([id number] from 12-03-2008) was reviewed at [facility] 01-31-2009, the [facility] report to be scanned into this EMR: low grade endometrial stromal sarcoma 9.5 cm, confined to uterine corpus, + LVSI, 0/19 nodes involved, benign bilateral ovaries and tubes, benign cervix; "per outside report, the tumor is diffusely and strongly positive for estrogen receptor and progesterone receptor".  She had severe hot flashes after surgery/ prior to beginning aromatase inhibitor. She was seen in consultation by Dr Carlton Adam began [place] 02-2009, which she tolerated poorly due to progressive joint aches, worse hot flashes and related insomnia; she DCd Femara in 12-2009 due to those symptoms. She tried Aromasin from 01-2010 thru 03-2010, also unable to tolerate "due to insomnia". She briefly resumed Femara at 1/2 prior dose. From 04-2012 thru 02-2013 she took raloxifene, prescribed by rheumatologist for low bone density. She has been followed with MRI scans (all at [facility]) and exams by Dr Josephina Shih now every 3 months.  Patient developed symptomatic vaginal vault prolapse, with robotically assisted laparoscopic vaginal vault suspension by Dr Suzan Garibaldi, urogynecology at [facility] in [place], on 04-22-2013. At time of surgery, she had "5 mm nodules of recurrent low grade endometrial stromal sarcoma" resected. (Operative note and that path not available in present records, however path was  reviewed at [facility], consistent with low grade endometrial stromal sarcoma). She was seen for second opinion consultation at  [facility] by Dr Chilton Greathouse on 05-22-13 (consult note copied from patient's records now and will be scanned into EMR). The consultation note suggests that all of this incidentally found recurrence was resected at time of the urogynecology procedure; recommendation was for monthly Faslodex. She had #3 Faslodex at [facility] 09-10-2013 and has continued monthly injections at [facility] since 10-08-2013. MRI AP 12-21-13 and 06-09-14 had no evidence of disease.      Review of systems as above, also: More discomfort in feet, where she has had problems x years, thought related to moving inlaws and daughter; feet are better since she resumed Voltaren. Slight allergic sinus symptoms and eyes itching. No lower respiratory symptoms. Voiding every 2 hrs now. Under a good bit of stress, including care for inlaws ages 28 and 61 Remainder of 10 point Review of Systems negative.  Objective:  Vital signs in last 24 hours:  BP 140/87 mmHg  Pulse 71  Temp(Src) 97.5 F (36.4 C) (Oral)  Resp 18  Ht 5' 4"  (1.626 m)  Wt 146 lb 9.6 oz (66.497 kg)  BMI 25.15 kg/m2  SpO2 100% Weight down 1 lb. Alert, oriented and appropriate. Ambulatory without difficulty.  Alopecia  HEENT:PERRL, sclerae not icteric. Oral mucosa moist without lesions, posterior pharynx clear.  Neck supple. No JVD.  Lymphatics:no cervical,supraclavicular or inguinal adenopathy Resp: clear to auscultation bilaterally and normal percussion bilaterally Cardio: regular rate and rhythm. No gallop. GI: soft, nontender, not distended, no mass or organomegaly. Normally active bowel sounds. Surgical incision not remarkable. Musculoskeletal/ Extremities: without pitting edema, cords, tenderness Neuro: no peripheral neuropathy. Otherwise nonfocal Skin without rash, ecchymosis, petechiae   Lab Results:  Results for orders placed or performed in visit on 02/03/15  CBC with Differential  Result Value Ref Range   WBC 9.6 3.9 - 10.3 10e3/uL   NEUT# 5.1 1.5 - 6.5 10e3/uL    HGB 13.6 11.6 - 15.9 g/dL   HCT 40.4 34.8 - 46.6 %   Platelets 265 145 - 400 10e3/uL   MCV 88.3 79.5 - 101.0 fL   MCH 29.7 25.1 - 34.0 pg   MCHC 33.6 31.5 - 36.0 g/dL   RBC 4.57 3.70 - 5.45 10e6/uL   RDW 12.9 11.2 - 14.5 %   lymph# 3.6 (H) 0.9 - 3.3 10e3/uL   MONO# 0.7 0.1 - 0.9 10e3/uL   Eosinophils Absolute 0.2 0.0 - 0.5 10e3/uL   Basophils Absolute 0.1 0.0 - 0.1 10e3/uL   NEUT% 52.6 38.4 - 76.8 %   LYMPH% 37.2 14.0 - 49.7 %   MONO% 7.3 0.0 - 14.0 %   EOS% 2.3 0.0 - 7.0 %   BASO% 0.6 0.0 - 2.0 %    CMET available after visit entirely normal  Studies/Results:  3D mammograms to be done at [facility] this afternoon Breast MRI scheduled for 04-29-15 (just under 3 months from today's mammograms)  Medications: I have reviewed the patient's current medications.  DISCUSSION Interval history reviewed as above. She is concerned that she may need to stop the Faslodex due to thinning of tissue in vaginal area, however we have discussed fact that tissue effects may be of lesser priority than the gyn malignancy.  Assessment/Plan:  1.low grade endometrial stromal sarcoma: history as above, incidentally found recurrent at urogynecologic procedure 04-2013, now on monthly Faslodex since ~ Nov 2014 per recommendation from [facility]. I will see her with treatment  in ~ 2 months, then MRI before Dr Mora Bellman next visit in early Nov. 2.history of intraductal papilloma left breast with atypia 1998 and personal dense breast tissue, mother and sister with lobular breast cancers, another sister with high risk for breast cancer. Breast MRI within 3 months after tomo mammograms today. 3.urologic symptoms intermittently with known cystocoele and previous bladder tuck 4.osteoporosis: Reclast given 09-28-14, on calcium with D and supplemental D3, now doing regular exercise  5.deafness right ear since childhood  6.hx basal cell skin ca  7.multiple miscarriages  8.osteoarthritis  9. Arthralgias from  aromatase inhibitors, resolved 10. 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 Faslodex orders confirmed. TIme spent 25 min including >50% counseling and coordination of care.     Florabel Faulks P, MD   02/03/2015, 9:54 AM

## 2015-02-03 NOTE — Telephone Encounter (Signed)
Appointments made and avs printed for patient °

## 2015-03-03 ENCOUNTER — Ambulatory Visit (HOSPITAL_BASED_OUTPATIENT_CLINIC_OR_DEPARTMENT_OTHER): Payer: BLUE CROSS/BLUE SHIELD

## 2015-03-03 VITALS — BP 153/83 | HR 76 | Temp 98.2°F

## 2015-03-03 DIAGNOSIS — C541 Malignant neoplasm of endometrium: Secondary | ICD-10-CM | POA: Diagnosis not present

## 2015-03-03 DIAGNOSIS — Z5111 Encounter for antineoplastic chemotherapy: Secondary | ICD-10-CM | POA: Diagnosis not present

## 2015-03-03 MED ORDER — FULVESTRANT 250 MG/5ML IM SOLN
500.0000 mg | Freq: Once | INTRAMUSCULAR | Status: AC
Start: 2015-03-03 — End: 2015-03-03
  Administered 2015-03-03: 500 mg via INTRAMUSCULAR
  Filled 2015-03-03: qty 10

## 2015-03-15 ENCOUNTER — Telehealth: Payer: Self-pay | Admitting: Oncology

## 2015-03-15 NOTE — Telephone Encounter (Signed)
Returned Advertising account executive. Patient confirmed appointment change from 08/25 to 09/01

## 2015-03-31 ENCOUNTER — Other Ambulatory Visit: Payer: BLUE CROSS/BLUE SHIELD

## 2015-03-31 ENCOUNTER — Ambulatory Visit: Payer: BLUE CROSS/BLUE SHIELD

## 2015-03-31 ENCOUNTER — Ambulatory Visit: Payer: BLUE CROSS/BLUE SHIELD | Admitting: Oncology

## 2015-04-03 ENCOUNTER — Other Ambulatory Visit: Payer: Self-pay | Admitting: Oncology

## 2015-04-07 ENCOUNTER — Ambulatory Visit (HOSPITAL_BASED_OUTPATIENT_CLINIC_OR_DEPARTMENT_OTHER): Payer: BLUE CROSS/BLUE SHIELD | Admitting: Oncology

## 2015-04-07 ENCOUNTER — Ambulatory Visit (HOSPITAL_BASED_OUTPATIENT_CLINIC_OR_DEPARTMENT_OTHER): Payer: BLUE CROSS/BLUE SHIELD

## 2015-04-07 ENCOUNTER — Telehealth: Payer: Self-pay | Admitting: Oncology

## 2015-04-07 ENCOUNTER — Encounter: Payer: Self-pay | Admitting: Oncology

## 2015-04-07 ENCOUNTER — Other Ambulatory Visit (HOSPITAL_BASED_OUTPATIENT_CLINIC_OR_DEPARTMENT_OTHER): Payer: BLUE CROSS/BLUE SHIELD

## 2015-04-07 VITALS — BP 141/83 | HR 93 | Temp 97.8°F | Resp 18 | Ht 64.0 in | Wt 148.0 lb

## 2015-04-07 DIAGNOSIS — D242 Benign neoplasm of left breast: Secondary | ICD-10-CM | POA: Diagnosis not present

## 2015-04-07 DIAGNOSIS — C541 Malignant neoplasm of endometrium: Secondary | ICD-10-CM

## 2015-04-07 DIAGNOSIS — Z803 Family history of malignant neoplasm of breast: Secondary | ICD-10-CM

## 2015-04-07 DIAGNOSIS — M810 Age-related osteoporosis without current pathological fracture: Secondary | ICD-10-CM

## 2015-04-07 DIAGNOSIS — Z5111 Encounter for antineoplastic chemotherapy: Secondary | ICD-10-CM

## 2015-04-07 DIAGNOSIS — R922 Inconclusive mammogram: Secondary | ICD-10-CM

## 2015-04-07 DIAGNOSIS — Z79899 Other long term (current) drug therapy: Secondary | ICD-10-CM

## 2015-04-07 LAB — COMPREHENSIVE METABOLIC PANEL (CC13)
ALT: 31 U/L (ref 0–55)
AST: 23 U/L (ref 5–34)
Albumin: 4.3 g/dL (ref 3.5–5.0)
Alkaline Phosphatase: 87 U/L (ref 40–150)
Anion Gap: 12 mEq/L — ABNORMAL HIGH (ref 3–11)
BUN: 16.8 mg/dL (ref 7.0–26.0)
CO2: 25 mEq/L (ref 22–29)
Calcium: 10.5 mg/dL — ABNORMAL HIGH (ref 8.4–10.4)
Chloride: 104 mEq/L (ref 98–109)
Creatinine: 0.8 mg/dL (ref 0.6–1.1)
EGFR: 83 mL/min/{1.73_m2} — ABNORMAL LOW (ref >90)
Glucose: 126 mg/dl (ref 70–140)
Potassium: 4.3 mEq/L (ref 3.5–5.1)
Sodium: 141 mEq/L (ref 136–145)
Total Bilirubin: 0.34 mg/dL (ref 0.20–1.20)
Total Protein: 7.6 g/dL (ref 6.4–8.3)

## 2015-04-07 LAB — CBC WITH DIFFERENTIAL/PLATELET
BASO%: 0.4 % (ref 0.0–2.0)
Basophils Absolute: 0 10*3/uL (ref 0.0–0.1)
EOS%: 2.8 % (ref 0.0–7.0)
Eosinophils Absolute: 0.3 10*3/uL (ref 0.0–0.5)
HCT: 42.7 % (ref 34.8–46.6)
HGB: 14.3 g/dL (ref 11.6–15.9)
LYMPH%: 37.3 % (ref 14.0–49.7)
MCH: 30.4 pg (ref 25.1–34.0)
MCHC: 33.5 g/dL (ref 31.5–36.0)
MCV: 90.7 fL (ref 79.5–101.0)
MONO#: 0.8 10*3/uL (ref 0.1–0.9)
MONO%: 8.4 % (ref 0.0–14.0)
NEUT#: 4.7 10*3/uL (ref 1.5–6.5)
NEUT%: 51.1 % (ref 38.4–76.8)
Platelets: 256 10*3/uL (ref 145–400)
RBC: 4.71 10*6/uL (ref 3.70–5.45)
RDW: 13 % (ref 11.2–14.5)
WBC: 9.2 10*3/uL (ref 3.9–10.3)
lymph#: 3.4 10*3/uL — ABNORMAL HIGH (ref 0.9–3.3)

## 2015-04-07 MED ORDER — FULVESTRANT 250 MG/5ML IM SOLN
500.0000 mg | Freq: Once | INTRAMUSCULAR | Status: AC
  Administered 2015-04-07: 500 mg via INTRAMUSCULAR
  Filled 2015-04-07: qty 10

## 2015-04-07 NOTE — Progress Notes (Signed)
OFFICE PROGRESS NOTE   April 07, 2015   Physicians:Daniel ClarkePearson, Knox Saliva ([facility]), Nicholas Lose, Verl Blalock (rheumatology, Nicholas), Leda Roys (PCP, [facility]), Suzan Garibaldi (urologic surgeon, [facility] [place]), (J.Matthew McDonald gyn onc Baldo Ash), [address] (GI, [place]), _ [facility], Baldo Ash)  INTERVAL HISTORY:  Patient is seen, alone for visit, in scheduled follow up of monthly Faslodex continuing for recurrent low grade endometrial stromal sarcoma, this used since 06-2013 at recommendation of Dr Knox Saliva at [facility]. Last imaging was MRI [facility] 12-01-14, which is to be repeated prior to next visit to Dr Josephina Shih in ~ Nov. She has [facility] 2 variant of uncertain significance. She is also high risk for breast cancer based on family history. Mammograms at [place] 02-04-15 (tomo) had heterogeneously dense tissue without other mammographic findings of concern; she is for breast MRI by Dr Lindi Adie 04-28-15.    Patient continues to tolerate the Faslodex injections generally without difficulty, tho she notices fatigue for ~ 24 hrs after each injection. Patient has no new or different complaints that seem concerning for progression of the endometrial stromal sarcoma. She does notice vaginal dryness that is somewhat uncomfortable, not improved with empiric treatment for HSV (with "small ulcers", but culture negative by gyn in [place]) and minimally better with treatment for yeast. She swam in chlorinated water quite a bit this summer, also in [place], not doing either of those activities now. Will try sitz baths daily. Patient wonders if the dryness is related to length of time on Faslodex. Otherwise no change in bowels, no abdominal or pelvic pain, bladder unchanged. No noted changes in breasts. Energy good, just back from vacation.    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 She had TAH BSO and staging 12-03-2008 by gyn oncology at [facility] in [place], with finding of stage IB low grade endometrial stromal sarcoma. That pathology ([id number] from 12-03-2008) was reviewed at [facility] 01-31-2009, the [facility] report to be scanned into this EMR: low grade endometrial stromal sarcoma 9.5 cm, confined to uterine corpus, + LVSI, 0/19 nodes involved, benign bilateral ovaries and tubes, benign cervix; "per outside report, the tumor is diffusely and strongly positive for estrogen receptor and progesterone receptor".  She had severe hot flashes after surgery/ prior to beginning aromatase inhibitor. She was seen in consultation by Dr Carlton Adam began [place] 02-2009, which she tolerated poorly due to progressive joint aches, worse hot flashes and related insomnia; she DCd Femara in 12-2009 due to those symptoms. She tried Aromasin from 01-2010 thru 03-2010, also unable to tolerate "due to insomnia". She briefly resumed Femara at 1/2 prior dose. From 04-2012 thru 02-2013 she took raloxifene, prescribed by rheumatologist for low bone density. She has been followed with MRI scans (all at [facility]) and exams by Dr Josephina Shih now every 3 months.  Patient developed symptomatic vaginal vault prolapse, with robotically assisted laparoscopic vaginal vault suspension by Dr Suzan Garibaldi, urogynecology at [facility] in [place], on 04-22-2013. At time of surgery, she had "5 mm nodules of recurrent low grade endometrial stromal sarcoma" resected. (Operative note and that path not available in present records, however path was reviewed at [facility], consistent with low grade endometrial stromal sarcoma). She was seen for second opinion consultation at [facility] by Dr Shanon Brow  Mauro Kaufmann on 05-22-13 (consult note copied from patient's  records now and will be scanned into EMR). The consultation note suggests that all of this incidentally found recurrence was resected at time of the urogynecology procedure; recommendation was for monthly Faslodex. She had #3 Faslodex at [facility] 09-10-2013 and has continued monthly injections at [facility] since 10-08-2013. MRI [facility] 12-21-13 had no evidence of disease. MRI [facility] 06-09-14 likewise no evidence of disease.    Review of systems as above, also: No fever or symptoms of infection. No respiratory symptoms. No LE swelling. No bleeding. Remainder of 10 point Review of Systems negative.  Objective:  Vital signs in last 24 hours:  BP 141/83 mmHg  Pulse 93  Temp(Src) 97.8 F (36.6 C) (Oral)  Resp 18  Ht _0  (1.626 m)  Wt 148 lb (67.132 kg)  BMI 25.39 kg/m2  SpO2 99% Weight up 1.5 lbs Alert, oriented and appropriate. Ambulatory without difficulty.   HEENT:PERRL, sclerae not icteric. Oral mucosa moist without lesions, posterior pharynx clear.  Neck supple. No JVD.  Lymphatics:no cervical,supraclavicular, axillary or inguinal adenopathy Resp: clear to auscultation bilaterally and normal percussion bilaterally Cardio: regular rate and rhythm. No gallop. GI: soft, nontender, not distended, no mass or organomegaly. Normally active bowel sounds.  Musculoskeletal/ Extremities: without pitting edema, cords, tenderness Neuro:  nonfocal  PSYCH appropriate mood and affect Skin without rash, ecchymosis, petechiae Breasts: without dominant mass, skin or nipple findings. Axillae benign.   Lab Results:  Results for orders placed or performed in visit on 04/07/15  CBC with Differential  Result Value Ref Range   WBC 9.2 3.9 - 10.3 10e3/uL   NEUT# 4.7 1.5 - 6.5 10e3/uL   HGB 14.3 11.6 - 15.9 g/dL   HCT 42.7 34.8 - 46.6 %   Platelets 256 145 - 400 10e3/uL   MCV 90.7 79.5 - 101.0 fL   MCH 30.4 25.1 - 34.0 pg   MCHC 33.5 31.5 - 36.0 g/dL   RBC 4.71 3.70 - 5.45 10e6/uL   RDW 13.0 11.2 - 14.5 %   lymph#  3.4 (H) 0.9 - 3.3 10e3/uL   MONO# 0.8 0.1 - 0.9 10e3/uL   Eosinophils Absolute 0.3 0.0 - 0.5 10e3/uL   Basophils Absolute 0.0 0.0 - 0.1 10e3/uL   NEUT% 51.1 38.4 - 76.8 %   LYMPH% 37.3 14.0 - 49.7 %   MONO% 8.4 0.0 - 14.0 %   EOS% 2.8 0.0 - 7.0 %   BASO% 0.4 0.0 - 2.0 %   CMET available after visit normal with exception of calcium 10.5  Studies/Results:   Medications: I have reviewed the patient's current medications.  DISCUSSION: vaginal and perineal dryness as above, suggested adding sitz baths and avoiding chlorinated water. No changes with Faslodex for now, follow up with Dr Josephina Shih including MRI [facility] ~ Nov  Assessment/Plan: 1.low grade endometrial stromal sarcoma: history as above, incidentally found recurrent at urogynecologic procedure 04-2013, now on monthly Faslodex since ~ Nov 2014 per recommendation from [facility]. Follow up MRI [facility] and Dr Josephina Shih [phone]. I will see her back ~ 08-2015 coordinating with Faslodex dates and other MDs. Labs Nov and Jan 2.history of intraductal papilloma left breast with atypia 1998 and personal dense breast tissue, mother and sister with lobular breast cancers, another sister with high risk for breast cancer. Bilateral tomo mammograms done 02-04-15. Breast MRI planned 04-28-15 per Dr Lindi Adie with high risk breast clinic. 3.urologic symptoms intermittently with known cystocoele and previous bladder tuck 4.osteoporosis: Reclast given 09-28-14, on calcium  with D and supplemental D3, now doing regular exercise  5.deafness right ear since childhood  6.hx basal cell skin ca  7.multiple miscarriages  8.osteoarthritis  9. Arthralgias from aromatase inhibitors, resolved 10. 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 Faslodex orders confirmed. Time spent 20 min including >50% counseling and coordination of care.     Benjiman Sedgwick P, MD   04/07/2015, 8:42 AM

## 2015-04-07 NOTE — Telephone Encounter (Signed)
Gave avs & appointment for September thru December. January for MD not available yet.

## 2015-04-08 ENCOUNTER — Other Ambulatory Visit: Payer: Self-pay | Admitting: Oncology

## 2015-04-08 DIAGNOSIS — Z803 Family history of malignant neoplasm of breast: Secondary | ICD-10-CM | POA: Insufficient documentation

## 2015-04-08 DIAGNOSIS — Z79899 Other long term (current) drug therapy: Secondary | ICD-10-CM | POA: Insufficient documentation

## 2015-04-08 DIAGNOSIS — R922 Inconclusive mammogram: Secondary | ICD-10-CM | POA: Insufficient documentation

## 2015-04-28 ENCOUNTER — Ambulatory Visit
Admission: RE | Admit: 2015-04-28 | Discharge: 2015-04-28 | Disposition: A | Discharge status: Home / Self Care | Payer: BLUE CROSS/BLUE SHIELD | Source: Ambulatory Visit | Attending: Hematology and Oncology | Admitting: Hematology and Oncology

## 2015-04-28 ENCOUNTER — Ambulatory Visit: Payer: BLUE CROSS/BLUE SHIELD

## 2015-04-28 DIAGNOSIS — D242 Benign neoplasm of left breast: Secondary | ICD-10-CM

## 2015-04-28 MED ORDER — GADOBENATE DIMEGLUMINE 529 MG/ML IV SOLN
13.0000 mL | Freq: Once | INTRAVENOUS | Status: AC | PRN
  Administered 2015-04-28: 13 mL via INTRAVENOUS

## 2015-05-05 ENCOUNTER — Ambulatory Visit (HOSPITAL_BASED_OUTPATIENT_CLINIC_OR_DEPARTMENT_OTHER): Payer: BLUE CROSS/BLUE SHIELD

## 2015-05-05 VITALS — BP 141/91 | HR 92 | Temp 98.4°F

## 2015-05-05 DIAGNOSIS — Z5111 Encounter for antineoplastic chemotherapy: Secondary | ICD-10-CM | POA: Diagnosis not present

## 2015-05-05 DIAGNOSIS — C541 Malignant neoplasm of endometrium: Secondary | ICD-10-CM | POA: Diagnosis not present

## 2015-05-05 MED ORDER — FULVESTRANT 250 MG/5ML IM SOLN
500.0000 mg | Freq: Once | INTRAMUSCULAR | Status: AC
  Administered 2015-05-05: 500 mg via INTRAMUSCULAR
  Filled 2015-05-05: qty 10

## 2015-05-26 ENCOUNTER — Ambulatory Visit: Payer: BLUE CROSS/BLUE SHIELD

## 2015-06-01 ENCOUNTER — Ambulatory Visit (HOSPITAL_COMMUNITY)
Admission: RE | Admit: 2015-06-01 | Discharge: 2015-06-01 | Disposition: A | Discharge status: Home / Self Care | Payer: BLUE CROSS/BLUE SHIELD | Source: Ambulatory Visit | Attending: Gynecologic Oncology | Admitting: Gynecologic Oncology

## 2015-06-01 ENCOUNTER — Ambulatory Visit: Payer: BLUE CROSS/BLUE SHIELD

## 2015-06-01 ENCOUNTER — Ambulatory Visit (HOSPITAL_BASED_OUTPATIENT_CLINIC_OR_DEPARTMENT_OTHER): Payer: BLUE CROSS/BLUE SHIELD

## 2015-06-01 ENCOUNTER — Ambulatory Visit (HOSPITAL_COMMUNITY): Payer: BLUE CROSS/BLUE SHIELD

## 2015-06-01 ENCOUNTER — Other Ambulatory Visit (HOSPITAL_BASED_OUTPATIENT_CLINIC_OR_DEPARTMENT_OTHER): Payer: BLUE CROSS/BLUE SHIELD

## 2015-06-01 VITALS — BP 140/87 | HR 98 | Temp 98.2°F

## 2015-06-01 DIAGNOSIS — Z90710 Acquired absence of both cervix and uterus: Secondary | ICD-10-CM | POA: Diagnosis not present

## 2015-06-01 DIAGNOSIS — K760 Fatty (change of) liver, not elsewhere classified: Secondary | ICD-10-CM | POA: Insufficient documentation

## 2015-06-01 DIAGNOSIS — C541 Malignant neoplasm of endometrium: Secondary | ICD-10-CM

## 2015-06-01 DIAGNOSIS — D390 Neoplasm of uncertain behavior of uterus: Secondary | ICD-10-CM | POA: Diagnosis present

## 2015-06-01 DIAGNOSIS — Z5111 Encounter for antineoplastic chemotherapy: Secondary | ICD-10-CM

## 2015-06-01 LAB — COMPREHENSIVE METABOLIC PANEL (CC13)
ALT: 43 U/L (ref 0–55)
AST: 25 U/L (ref 5–34)
Albumin: 4.3 g/dL (ref 3.5–5.0)
Alkaline Phosphatase: 83 U/L (ref 40–150)
Anion Gap: 10 mEq/L (ref 3–11)
BUN: 13.7 mg/dL (ref 7.0–26.0)
CO2: 24 mEq/L (ref 22–29)
Calcium: 9.8 mg/dL (ref 8.4–10.4)
Chloride: 106 mEq/L (ref 98–109)
Creatinine: 1 mg/dL (ref 0.6–1.1)
EGFR: 64 mL/min/{1.73_m2} — ABNORMAL LOW (ref >90)
Glucose: 121 mg/dl (ref 70–140)
Potassium: 4.1 mEq/L (ref 3.5–5.1)
Sodium: 140 mEq/L (ref 136–145)
Total Bilirubin: 0.46 mg/dL (ref 0.20–1.20)
Total Protein: 7.4 g/dL (ref 6.4–8.3)

## 2015-06-01 LAB — CBC WITH DIFFERENTIAL/PLATELET
BASO%: 0.8 % (ref 0.0–2.0)
Basophils Absolute: 0.1 10*3/uL (ref 0.0–0.1)
EOS%: 2.7 % (ref 0.0–7.0)
Eosinophils Absolute: 0.3 10*3/uL (ref 0.0–0.5)
HCT: 40.2 % (ref 34.8–46.6)
HGB: 13.5 g/dL (ref 11.6–15.9)
LYMPH%: 33.7 % (ref 14.0–49.7)
MCH: 30.1 pg (ref 25.1–34.0)
MCHC: 33.6 g/dL (ref 31.5–36.0)
MCV: 89.7 fL (ref 79.5–101.0)
MONO#: 0.6 10*3/uL (ref 0.1–0.9)
MONO%: 5.8 % (ref 0.0–14.0)
NEUT#: 5.7 10*3/uL (ref 1.5–6.5)
NEUT%: 57 % (ref 38.4–76.8)
Platelets: 269 10*3/uL (ref 145–400)
RBC: 4.47 10*6/uL (ref 3.70–5.45)
RDW: 13.1 % (ref 11.2–14.5)
WBC: 10 10*3/uL (ref 3.9–10.3)
lymph#: 3.4 10*3/uL — ABNORMAL HIGH (ref 0.9–3.3)

## 2015-06-01 MED ORDER — FULVESTRANT 250 MG/5ML IM SOLN
500.0000 mg | Freq: Once | INTRAMUSCULAR | Status: AC
  Administered 2015-06-01: 500 mg via INTRAMUSCULAR
  Filled 2015-06-01: qty 10

## 2015-06-01 MED ORDER — GADOBENATE DIMEGLUMINE 529 MG/ML IV SOLN
15.0000 mL | Freq: Once | INTRAVENOUS | Status: AC | PRN
  Administered 2015-06-01: 14 mL via INTRAVENOUS

## 2015-06-09 ENCOUNTER — Encounter: Payer: Self-pay | Admitting: Gynecology

## 2015-06-09 ENCOUNTER — Ambulatory Visit: Payer: BLUE CROSS/BLUE SHIELD | Attending: Gynecology | Admitting: Gynecology

## 2015-06-09 VITALS — BP 136/84 | HR 75 | Temp 97.8°F | Resp 18 | Ht 64.0 in | Wt 144.8 lb

## 2015-06-09 DIAGNOSIS — N8110 Cystocele, unspecified: Secondary | ICD-10-CM

## 2015-06-09 DIAGNOSIS — D390 Neoplasm of uncertain behavior of uterus: Secondary | ICD-10-CM

## 2015-06-09 DIAGNOSIS — C541 Malignant neoplasm of endometrium: Secondary | ICD-10-CM

## 2015-06-09 MED ORDER — CLOBETASOL PROPIONATE 0.05 % EX OINT: 1.0000 "application " | TOPICAL_OINTMENT | Freq: Two times a day (BID) | CUTANEOUS | Status: DC | PRN

## 2015-06-09 NOTE — Progress Notes (Signed)
Consult Note: Gyn-Onc   Karla Watts 60 y.o. female  Chief Complaint  Patient presents with  . Uterine Sarcoma    Follow up    Assessment : Recurrent low-grade endometrial stromal sarcoma. Clinically free of disease. Cystocele which is relatively asymptomatic. Vulvitis.  Plan: We will continue Faslodex 500 mg IM every 4 weeks. She returned to see me in 6 months and we will obtain an MRI shortly prior to that visit. She is given a prescription for clobetasol 0.05% ointment to be applied up to twice a day as needed for vulvar itching. I have offered her an opportunity to have an appointment with Dr. Maryland Pink regarding evaluation and management of her cystocele. This time the patient wishes to defer that appointment. She also needs to have follow-up in the breast clinic and she will make those arrangements herself. Finally, she will schedule follow-up appointment at [facility].  Interval History: The patient returns today for continuing followup of low-grade endometrial stromal sarcoma. A recurrence was discovered in September 2014. Subsequently the patient's been on Faslodex 500 mg IM every 4 weeks. She was seen at [facility] in December 2015 and encouraged to continue taking Faslodex. She had an MRI earlier this week that shows no evidence of disease.  She denies any abdominal or GI symptoms. Functional status is excellent.   She has some symptoms of protrusion from her cystocele and minimal urinary incontinence. She has seen her gynecologist in [place] who treated her with oral metronidazole and topical lidocaine for what sounds like vulvitis. She has a number of concerns about the cystocele but on the other hand does not wish to see a urogynecology is at this point in time.     HPI: Patient developed abdominal pain and an 8 cm intrauterine mass in March of 2010. This is thought to be a fibroid. Preoperative endometrial biopsy and Pap smear were normal.  She underwent a total abdominal hysterectomy bilateral salpingo-oophorectomy and staging procedures by a gynecologic oncologist. Final pathology showed a low-grade endometrial stromal sarcoma. She received a short course of Femara but did not tolerate it well therefore discontinued therapy. She was then followed until September 2014 with a recurrence was found in the pelvic peritoneum at the time of robotic assisted vaginal vault suspension. The patient sought second opinion consultation at [facility] with the sarcoma service. Multiple options were recommended for adjuvant treatment and subsequently the patient chose be treated with Faslodex.  Review of Systems:10 point review of systems is negative except as noted in interval history.   Vitals: Blood pressure 136/84, pulse 75, temperature 97.8 F (36.6 C), temperature source Oral, resp. rate 18, height 5\' 4"  (1.626 m), weight 144 lb 12.8 oz (65.681 kg), SpO2 100 %.  Physical Exam: General : The patient is a healthy woman in no acute distress.  HEENT: normocephalic, extraoccular movements normal; neck is supple without thyromegally  Lynphnodes: Supraclavicular and inguinal nodes not enlarged  Abdomen: Soft, non-tender, no ascites, no organomegally, no masses, no hernias  Pelvic:  EGBUS: Normal female  Vagina: Normal, no lesions , she does have a moderate cystocele extending to the introitus. The apex of the vagina is well supported.Marland Kitchen Urethra and Bladder: Normal, non-tender  Cervix: Surgically absent  Uterus: Surgically absent  Bi-manual examination: Non-tender; no adenxal masses or nodularity  Rectal: normal sphincter tone, no masses, no blood  Lower extremities: No edema or varicosities. Normal range of motion      Allergies  Allergen Reactions  . Ciprofloxacin Swelling  . Morphine And Related Nausea And Vomiting  . Penicillins Nausea Only  . Sulfur Hives    Past Medical History  Diagnosis Date  .  Osteoporosis   . Allergy   . Uterine cancer ([place]) 2010    Endometrial Stromal Sarcoma    Past Surgical History  Procedure Laterality Date  . Abdominal hysterectomy      Current Outpatient Prescriptions  Medication Sig Dispense Refill  . bisacodyl (DULCOLAX) 5 MG EC tablet Take 5 mg by mouth daily as needed for moderate constipation.    . Calcium-Vitamin D-Vitamin K (VIACTIV) [id number] MG-UNT-MCG CHEW Chew 1 tablet by mouth daily.    . cetirizine (ZYRTEC) 10 MG tablet Take 10 mg by mouth daily as needed for allergies.    . Cholecalciferol (VITAMIN D3) 1000 UNITS CAPS Take 1 capsule by mouth daily.    . clotrimazole-betamethasone (LOTRISONE) cream   0  . cyclobenzaprine (FLEXERIL) 10 MG tablet Take 5 mg by mouth at bedtime as needed.    . diclofenac (VOLTAREN) 75 MG EC tablet Take 75 mg by mouth 2 (two) times daily.   0  . fulvestrant (FASLODEX) 250 MG/5ML injection Inject 500 mg into the muscle every 30 (thirty) days. One injection each buttock over 1-2 minutes. Warm prior to use.    . lidocaine (XYLOCAINE) 5 % ointment APPLY TOPICALLY 3 TIMES A DAY FOR 7 DAYS AS NEEDED FOR PAIN  0  . Multiple Vitamin (MULTIVITAMIN) capsule Take 1 capsule by mouth daily.    . polyethylene glycol (MIRALAX / GLYCOLAX) packet Take 17 g by mouth daily.     . Simethicone (GAS-X PO) Take 1 tablet by mouth as needed.    . valACYclovir (VALTREX) 1000 MG tablet Take 1,000 mg by mouth 2 (two) times daily. for 10 days  0  . zoledronic acid (RECLAST) 5 MG/100ML SOLN injection Inject 5 mg into the vein once. Pt received this medication on Feb.23,2016. Due annually.     No current facility-administered medications for this visit.    Social History   Social History  . Marital Status: Married    Spouse Name: N/A  . Number of Children: N/A  . Years of Education: N/A   Occupational History  . Not on file.   Social History Main Topics  . Smoking status: Never Smoker   . Smokeless tobacco: Not on file  .  Alcohol Use: Yes     Comment: 1-2 per week  . Drug Use: No  . Sexual Activity: Not on file   Other Topics Concern  . Not on file   Social History Narrative    Family History  Problem Relation Age of Onset  . Breast cancer Mother 31    lobular breast cancer  . Lymphoma Father 40  . Breast cancer Sister 65    lobular breast cancer  . Stroke Maternal Grandfather   . Leukemia Paternal Grandmother     dx in her 35s  . Heart attack Paternal Lindajo Royal, MD 06/09/2015, 9:36 AM

## 2015-06-09 NOTE — Addendum Note (Signed)
Addended by: Joylene John D on: 06/09/2015 10:31 AM   Modules accepted: Orders

## 2015-06-09 NOTE — Patient Instructions (Addendum)
You will receive a phone call from [facility] to go over screening questions for your MRI and they will give you an appt date and time.  Plan to follow up in six months or sooner if needed.  You will have your MRI before your appointment.  Contact the vulvar clinic for an appointment.  Use the clobetasol cream once or twice daily as needed for vulvar itching.  Please call for any questions or concerns.  Clobetasol Propionate skin cream What is this medicine? CLOBETASOL (kloe BAY ta sol) is a corticosteroid. It is used on the skin to treat itching, redness, and swelling caused by some skin conditions. This medicine may be used for other purposes; ask your health care provider or pharmacist if you have questions. What should I tell my health care provider before I take this medicine? They need to know if you have any of these conditions: -any type of active infection including measles, tuberculosis, herpes, or chickenpox -circulation problems or vascular disease -large areas of burned or damaged skin -rosacea -skin wasting or thinning -an unusual or allergic reaction to clobetasol, corticosteroids, other medicines, foods, dyes, or preservatives -pregnant or trying to get pregnant -breast-feeding How should I use this medicine? This medicine is for external use only. Do not take by mouth. Follow the directions on the prescription label. Wash your hands before and after use. Apply a thin film of medicine to the affected area. Do not cover with a bandage or dressing unless your doctor or health care professional tells you to. Do not get this medicine in your eyes. If you do, rinse out with plenty of cool tap water. It is important not to use more medicine than prescribed. Do not use your medicine more often than directed. To do so may increase the chance of side effects. Talk to your pediatrician regarding the use of this medicine in children. Special care may be needed. Elderly patients are  more likely to have damaged skin through aging, and this may increase side effects. This medicine should only be used for brief periods and infrequently in older patients. Overdosage: If you think you have taken too much of this medicine contact a poison control center or emergency room at once. NOTE: This medicine is only for you. Do not share this medicine with others. What if I miss a dose? If you miss a dose, use it as soon as you can. If it is almost time for your next dose, use only that dose. Do not use double or extra doses. What may interact with this medicine? Interactions are not expected. Do not use cosmetics or other skin care products on the treated area. This list may not describe all possible interactions. Give your health care provider a list of all the medicines, herbs, non-prescription drugs, or dietary supplements you use. Also tell them if you smoke, drink alcohol, or use illegal drugs. Some items may interact with your medicine. What should I watch for while using this medicine? Tell your doctor or health care professional if your symptoms do not get better within 2 weeks, or if you develop skin irritation from the medicine. Tell your doctor or health care professional if you are exposed to anyone with measles or chickenpox, or if you develop sores or blisters that do not heal properly. What side effects may I notice from receiving this medicine? Side effects that you should report to your doctor or health care professional as soon as possible: -allergic reactions like skin rash,  itching or hives, swelling of the face, lips, or tongue -changes in vision -lack of healing of the skin condition -painful, red, pus filled blisters on the skin or in hair follicles -thinning of the skin with easy bruising Side effects that usually do not require medical attention (report to your doctor or health care professional if they continue or are bothersome): -burning, irritation of the  skin -redness or scaling of the skin This list may not describe all possible side effects. Call your doctor for medical advice about side effects. You may report side effects to FDA at 1-800-FDA-1088. Where should I keep my medicine? Keep out of the reach of children. Store at room temperature between 15 and 30 degrees C (59 and 86 degrees F). Keep away from heat and direct light. Do not freeze. Throw away any unused medicine after the expiration date. NOTE: This sheet is a summary. It may not cover all possible information. If you have questions about this medicine, talk to your doctor, pharmacist, or health care provider.    2016, Elsevier/Gold Standard. (2007-10-29 16:56:45)

## 2015-06-10 ENCOUNTER — Telehealth: Payer: Self-pay

## 2015-06-10 NOTE — Telephone Encounter (Signed)
Re referral to [facility]  Fine to make referral, but she should let us know which MD she prefers there:  If she plans to continue to see Dr Josephina Shih, then she probably would be best establishing with medical oncology in [place], and I would recommend one of the breast medical oncologists if so. If she wants gyn oncology in [place], would start with that person, who may be able to do Faslodex without medical oncology.    thanks

## 2015-06-10 NOTE — Telephone Encounter (Signed)
Ms Wynn will continue care with Dr. Marko Plume and Dr. Fermin Schwab.  She will search the [facility] area for a  medical oncologist that specialize in breast cancer.  She will call back with names of physicians for Dr. Marko Plume to review and make referral.

## 2015-06-10 NOTE — Telephone Encounter (Signed)
Karla Watts is calling to request a referral to oncologist at  [facility] in [place] so she can receive her Faslodex injectinon there after the new year.   will not be in her insurance network for 2017. She will continue to see Dr. Marko Plume.  She will be able to absorb the cost of the visits but the the injection appointments.

## 2015-06-15 ENCOUNTER — Telehealth: Payer: Self-pay

## 2015-06-15 NOTE — Telephone Encounter (Signed)
Spoke with Ms. Karla Watts and told her that a referral was made to [facility]. [phone]. Gave information to Renika in appointments who will forward information to new patient referrals.  She should be contacted within the next 48 hours to set up an appointment. Sent recent notes, labs, and scans as well as original pathology report from [facility] in 11-2008. Gave name of Karla Watts as a first choice of patient. Richard Dema Severin is an Agricultural engineer so he would not be appropriate for Ms. See at this time. [name] will follow up with [facility] if she does not hear from them in the next 24 hours.

## 2015-06-23 ENCOUNTER — Ambulatory Visit: Payer: BLUE CROSS/BLUE SHIELD

## 2015-06-24 ENCOUNTER — Other Ambulatory Visit: Payer: Self-pay

## 2015-06-24 DIAGNOSIS — Z1231 Encounter for screening mammogram for malignant neoplasm of breast: Secondary | ICD-10-CM

## 2015-07-04 ENCOUNTER — Ambulatory Visit (HOSPITAL_BASED_OUTPATIENT_CLINIC_OR_DEPARTMENT_OTHER): Payer: BLUE CROSS/BLUE SHIELD

## 2015-07-04 ENCOUNTER — Ambulatory Visit: Payer: BLUE CROSS/BLUE SHIELD

## 2015-07-04 ENCOUNTER — Other Ambulatory Visit (HOSPITAL_BASED_OUTPATIENT_CLINIC_OR_DEPARTMENT_OTHER): Payer: BLUE CROSS/BLUE SHIELD

## 2015-07-04 VITALS — BP 139/85 | HR 88 | Temp 98.4°F

## 2015-07-04 DIAGNOSIS — C541 Malignant neoplasm of endometrium: Secondary | ICD-10-CM

## 2015-07-04 DIAGNOSIS — Z5111 Encounter for antineoplastic chemotherapy: Secondary | ICD-10-CM | POA: Diagnosis not present

## 2015-07-04 LAB — CBC WITH DIFFERENTIAL/PLATELET
BASO%: 0.3 % (ref 0.0–2.0)
Basophils Absolute: 0 10*3/uL (ref 0.0–0.1)
EOS%: 3 % (ref 0.0–7.0)
Eosinophils Absolute: 0.3 10*3/uL (ref 0.0–0.5)
HCT: 40.9 % (ref 34.8–46.6)
HGB: 13.5 g/dL (ref 11.6–15.9)
LYMPH%: 33.6 % (ref 14.0–49.7)
MCH: 30.4 pg (ref 25.1–34.0)
MCHC: 33 g/dL (ref 31.5–36.0)
MCV: 92.1 fL (ref 79.5–101.0)
MONO#: 0.8 10*3/uL (ref 0.1–0.9)
MONO%: 7.8 % (ref 0.0–14.0)
NEUT#: 5.4 10*3/uL (ref 1.5–6.5)
NEUT%: 55.3 % (ref 38.4–76.8)
Platelets: 270 10*3/uL (ref 145–400)
RBC: 4.44 10*6/uL (ref 3.70–5.45)
RDW: 12.8 % (ref 11.2–14.5)
WBC: 9.9 10*3/uL (ref 3.9–10.3)
lymph#: 3.3 10*3/uL (ref 0.9–3.3)

## 2015-07-04 LAB — COMPREHENSIVE METABOLIC PANEL (CC13)
ALT: 25 U/L (ref 0–55)
AST: 18 U/L (ref 5–34)
Albumin: 4.1 g/dL (ref 3.5–5.0)
Alkaline Phosphatase: 77 U/L (ref 40–150)
Anion Gap: 9 mEq/L (ref 3–11)
BUN: 15.6 mg/dL (ref 7.0–26.0)
CO2: 26 mEq/L (ref 22–29)
Calcium: 10 mg/dL (ref 8.4–10.4)
Chloride: 106 mEq/L (ref 98–109)
Creatinine: 0.7 mg/dL (ref 0.6–1.1)
EGFR: >90 mL/min/{1.73_m2} (ref >90)
Glucose: 109 mg/dl (ref 70–140)
Potassium: 4.2 mEq/L (ref 3.5–5.1)
Sodium: 141 mEq/L (ref 136–145)
Total Bilirubin: <0.3 mg/dL (ref 0.20–1.20)
Total Protein: 7.5 g/dL (ref 6.4–8.3)

## 2015-07-04 MED ORDER — FULVESTRANT 250 MG/5ML IM SOLN
500.0000 mg | Freq: Once | INTRAMUSCULAR | Status: AC
Start: 2015-07-04 — End: 2015-07-04
  Administered 2015-07-04: 500 mg via INTRAMUSCULAR
  Filled 2015-07-04: qty 10

## 2015-07-20 ENCOUNTER — Telehealth: Payer: Self-pay | Admitting: Oncology

## 2015-07-20 NOTE — Telephone Encounter (Signed)
Returned patient call re cancelling 12/28 inj. Per patient she is not going to r/s at this time due to she is thinking about moving care to the [facility] down in [place] which is closer to where she lives but has some reservations. Per patient she has an appointment with them 12/28 and she will call back to r/s her 12/28 injection here if needed and wants to leave 08/29/15 appointments in tact. Per patient if all goes well she will call back to cx 08/29/15.

## 2015-07-21 ENCOUNTER — Ambulatory Visit: Payer: BLUE CROSS/BLUE SHIELD

## 2015-08-03 ENCOUNTER — Ambulatory Visit: Payer: BLUE CROSS/BLUE SHIELD

## 2015-08-23 ENCOUNTER — Telehealth: Payer: Self-pay | Admitting: Oncology

## 2015-08-23 NOTE — Telephone Encounter (Signed)
Patient called in and left a message to cancel all appts with dr ll lab etc as she is temopor. Moving her care to [place]

## 2015-08-27 ENCOUNTER — Other Ambulatory Visit: Payer: Self-pay | Admitting: Oncology

## 2015-08-29 ENCOUNTER — Other Ambulatory Visit: Payer: BLUE CROSS/BLUE SHIELD

## 2015-08-29 ENCOUNTER — Ambulatory Visit: Payer: BLUE CROSS/BLUE SHIELD | Admitting: Oncology

## 2015-08-29 ENCOUNTER — Ambulatory Visit: Payer: BLUE CROSS/BLUE SHIELD

## 2015-09-09 ENCOUNTER — Telehealth: Payer: Self-pay | Admitting: Oncology

## 2015-09-09 NOTE — Telephone Encounter (Signed)
FAXED RECORDS TO PCP DR. Elmon Else PER PT SIGNED REQUEST [phone] RELEASE ID

## 2015-12-16 ENCOUNTER — Ambulatory Visit: Payer: BLUE CROSS/BLUE SHIELD | Admitting: Gynecology

## 2015-12-23 ENCOUNTER — Ambulatory Visit: Payer: BLUE CROSS/BLUE SHIELD | Admitting: Gynecology

## 2016-01-06 ENCOUNTER — Ambulatory Visit: Payer: BLUE CROSS/BLUE SHIELD | Attending: Gynecology | Admitting: Gynecology

## 2016-01-06 ENCOUNTER — Encounter: Payer: Self-pay | Admitting: Gynecology

## 2016-01-06 VITALS — BP 136/82 | HR 89 | Temp 98.3°F | Resp 18 | Ht 64.0 in | Wt 148.3 lb

## 2016-01-06 DIAGNOSIS — N8110 Cystocele, unspecified: Secondary | ICD-10-CM | POA: Diagnosis not present

## 2016-01-06 DIAGNOSIS — M810 Age-related osteoporosis without current pathological fracture: Secondary | ICD-10-CM | POA: Insufficient documentation

## 2016-01-06 DIAGNOSIS — C541 Malignant neoplasm of endometrium: Secondary | ICD-10-CM | POA: Insufficient documentation

## 2016-01-06 DIAGNOSIS — Z880 Allergy status to penicillin: Secondary | ICD-10-CM | POA: Diagnosis not present

## 2016-01-06 DIAGNOSIS — Z79811 Long term (current) use of aromatase inhibitors: Secondary | ICD-10-CM

## 2016-01-06 DIAGNOSIS — Z90710 Acquired absence of both cervix and uterus: Secondary | ICD-10-CM | POA: Insufficient documentation

## 2016-01-06 DIAGNOSIS — D390 Neoplasm of uncertain behavior of uterus: Secondary | ICD-10-CM

## 2016-01-06 DIAGNOSIS — N762 Acute vulvitis: Secondary | ICD-10-CM | POA: Diagnosis not present

## 2016-01-06 NOTE — Patient Instructions (Signed)
Plan to follow up in six months or sooner if needed.  Please call for any questions or concerns.  Please call us if you would like a referral to meet with Dr. Maryland Pink with [facility] to discuss your cystocele.

## 2016-01-06 NOTE — Progress Notes (Signed)
Consult Note: Gyn-Onc   Karla Watts 60 y.o. female  Chief Complaint  Patient presents with  . Follow-up    Assessment : Recurrent low-grade endometrial stromal sarcoma. Clinically free of disease. Cystocele which is relatively asymptomatic. Vulvitis.  Plan:She was recently switched to letrozole and will continue that regimen as long as is well tolerated. Her osteoporosis is being managed in [place] as is her vulvitis. With regard to her cystocele, if the patient became more symptomatic, I recommended be evaluated by Dr. Maryland Pink.. She returned to see me in 6 months and we will obtain an MRI shortly prior to that visit. She also needs to have follow-up in the breast clinic and she will make those arrangements herself. Finally, she will schedule follow-up appointment at [facility].  Interval History: The patient returns today for continuing followup of low-grade endometrial stromal sarcoma. A recurrence was discovered in September 2014. Subsequently the patient's been on Faslodex 500 mg IM every 4 weeks. She recently switched to letrozole 2.5 mg daily. She had an MRI in April 2017 that showed no evidence of disease. She continues to have difficulties with the cystocele which apparently protrudes past the introitus when she is standing and she feels that this is contributing to her vulvar irritation. She has been seen a gynecologist in [place] regarding her ball Vitas.   She denies any abdominal or GI symptoms. Functional status is excellent.    She is up-to-date with mammograms. She notes that she's had some improvement in her osteoporosis based on bone scans.    HPI: Patient developed abdominal pain and an 8 cm intrauterine mass in March of 2010. This is thought to be a fibroid. Preoperative endometrial biopsy and Pap smear were normal. She underwent a total abdominal hysterectomy bilateral salpingo-oophorectomy and staging procedures by a gynecologic oncologist.  Final pathology showed a low-grade endometrial stromal sarcoma. She received a short course of Femara but did not tolerate it well therefore discontinued therapy. She was then followed until September 2014 with a recurrence was found in the pelvic peritoneum at the time of robotic assisted vaginal vault suspension. The patient sought second opinion consultation at [facility] with the sarcoma service. Multiple options were recommended for adjuvant treatment and subsequently the patient chose be treated with Faslodex.  In 2017 the patient switched to letrozole 2.5 mg daily.  Review of Systems:10 point review of systems is negative except as noted in interval history.   Vitals: Blood pressure 136/82, pulse 89, temperature 98.3 F (36.8 C), temperature source Oral, resp. rate 18, height 5\' 4"  (1.626 m), weight 148 lb 4.8 oz (67.268 kg), SpO2 99 %.  Physical Exam: General : The patient is a healthy woman in no acute distress.  HEENT: normocephalic, extraoccular movements normal; neck is supple without thyromegally  Lynphnodes: Supraclavicular and inguinal nodes not enlarged  Abdomen: Soft, non-tender, no ascites, no organomegally, no masses, no hernias  Pelvic:  EGBUS: Normal female  Vagina: Normal, no lesions , she does have a moderate cystocele extending to the introitus. The apex of the vagina is well supported.Marland Kitchen Urethra and Bladder: Normal, non-tender  Cervix: Surgically absent  Uterus: Surgically absent  Bi-manual examination: Non-tender; no adenxal masses or nodularity  Rectal: normal sphincter tone, no masses, no blood  Lower extremities: No edema or varicosities. Normal range of motion      Allergies  Allergen Reactions  . Ciprofloxacin Swelling  . Morphine And Related Nausea And Vomiting  . Penicillins Nausea Only  .  Sulfur Hives    Past Medical History  Diagnosis Date  . Osteoporosis   . Allergy   . Uterine cancer ([place]) 2010    Endometrial  Stromal Sarcoma    Past Surgical History  Procedure Laterality Date  . Abdominal hysterectomy      Current Outpatient Prescriptions  Medication Sig Dispense Refill  . cetirizine (ZYRTEC) 10 MG tablet Take 10 mg by mouth daily as needed for allergies.    . Cholecalciferol (VITAMIN D3) 1000 UNITS CAPS Take 1 capsule by mouth daily.    . clotrimazole-betamethasone (LOTRISONE) cream   0  . letrozole (FEMARA) 2.5 MG tablet Take 2.5 mg by mouth daily.    Marland Kitchen lidocaine (XYLOCAINE) 5 % ointment APPLY TOPICALLY 3 TIMES A DAY FOR 7 DAYS AS NEEDED FOR PAIN  0  . meloxicam (MOBIC) 15 MG tablet Take 15 mg by mouth daily.    . Multiple Vitamin (MULTIVITAMIN) capsule Take 1 capsule by mouth daily.    . polyethylene glycol (MIRALAX / GLYCOLAX) packet Take 17 g by mouth daily.     Marland Kitchen tiZANidine (ZANAFLEX) 4 MG capsule Take 4 mg by mouth at bedtime.    . zoledronic acid (RECLAST) 5 MG/100ML SOLN injection Inject 5 mg into the vein once. Pt received this medication on Feb.23,2016. Due annually.    . Calcium-Vitamin D-Vitamin K (VIACTIV) [id number] MG-UNT-MCG CHEW Chew 1 tablet by mouth daily.     No current facility-administered medications for this visit.    Social History   Social History  . Marital Status: Married    Spouse Name: N/A  . Number of Children: N/A  . Years of Education: N/A   Occupational History  . Not on file.   Social History Main Topics  . Smoking status: Never Smoker   . Smokeless tobacco: Not on file  . Alcohol Use: Yes     Comment: 1-2 per week  . Drug Use: No  . Sexual Activity: Not on file   Other Topics Concern  . Not on file   Social History Narrative    Family History  Problem Relation Age of Onset  . Breast cancer Mother 11    lobular breast cancer  . Lymphoma Father 44  . Breast cancer Sister 40    lobular breast cancer  . Stroke Maternal Grandfather   . Leukemia Paternal Grandmother     dx in her 40s  . Heart attack Paternal Lindajo Royal, MD 01/06/2016, 9:44 AM

## 2016-01-11 ENCOUNTER — Telehealth: Payer: Self-pay

## 2016-01-11 ENCOUNTER — Other Ambulatory Visit: Payer: Self-pay

## 2016-01-11 DIAGNOSIS — C541 Malignant neoplasm of endometrium: Secondary | ICD-10-CM

## 2016-01-11 NOTE — Telephone Encounter (Signed)
Early to mid July is fine   X 30 min + lab Thank you

## 2016-01-11 NOTE — Telephone Encounter (Signed)
Karla Watts called wanting to make an appt with Dr Marko Plume. She has a packet of information and labs from her [facility] MDs. She is having trouble finding a gynecologic dermatologist. POF not sent awaiting input from Dr Marko Plume on when to see her.

## 2016-01-12 ENCOUNTER — Telehealth: Payer: Self-pay | Admitting: Oncology

## 2016-01-12 NOTE — Telephone Encounter (Signed)
Told Karla Watts that an appointmnent was scheduled for 02-20-16 ay 0900 lab with visit at 0930 with Dr. Marko Plume. Aletter is in the mail with appointment information.

## 2016-01-12 NOTE — Telephone Encounter (Signed)
appt made and letter sent by mail °

## 2016-02-14 ENCOUNTER — Ambulatory Visit: Payer: BLUE CROSS/BLUE SHIELD

## 2016-02-19 ENCOUNTER — Other Ambulatory Visit: Payer: Self-pay | Admitting: Oncology

## 2016-02-20 ENCOUNTER — Other Ambulatory Visit (HOSPITAL_BASED_OUTPATIENT_CLINIC_OR_DEPARTMENT_OTHER): Payer: BLUE CROSS/BLUE SHIELD

## 2016-02-20 ENCOUNTER — Ambulatory Visit (HOSPITAL_BASED_OUTPATIENT_CLINIC_OR_DEPARTMENT_OTHER): Payer: BLUE CROSS/BLUE SHIELD | Admitting: Oncology

## 2016-02-20 ENCOUNTER — Encounter: Payer: Self-pay | Admitting: Oncology

## 2016-02-20 VITALS — BP 166/92 | HR 74 | Temp 98.2°F | Resp 18 | Ht 64.0 in | Wt 146.9 lb

## 2016-02-20 DIAGNOSIS — C541 Malignant neoplasm of endometrium: Secondary | ICD-10-CM

## 2016-02-20 LAB — COMPREHENSIVE METABOLIC PANEL
ALT: 31 U/L (ref 0–55)
AST: 19 U/L (ref 5–34)
Albumin: 4 g/dL (ref 3.5–5.0)
Alkaline Phosphatase: 77 U/L (ref 40–150)
Anion Gap: 10 mEq/L (ref 3–11)
BUN: 19.6 mg/dL (ref 7.0–26.0)
CO2: 25 mEq/L (ref 22–29)
Calcium: 9.8 mg/dL (ref 8.4–10.4)
Chloride: 107 mEq/L (ref 98–109)
Creatinine: 0.7 mg/dL (ref 0.6–1.1)
EGFR: 90 mL/min/{1.73_m2} — ABNORMAL LOW (ref >90)
Glucose: 88 mg/dl (ref 70–140)
Potassium: 4 mEq/L (ref 3.5–5.1)
Sodium: 141 mEq/L (ref 136–145)
Total Bilirubin: 0.4 mg/dL (ref 0.20–1.20)
Total Protein: 7.4 g/dL (ref 6.4–8.3)

## 2016-02-20 LAB — CBC WITH DIFFERENTIAL/PLATELET
BASO%: 0.8 % (ref 0.0–2.0)
Basophils Absolute: 0.1 10*3/uL (ref 0.0–0.1)
EOS%: 1.9 % (ref 0.0–7.0)
Eosinophils Absolute: 0.2 10*3/uL (ref 0.0–0.5)
HCT: 41.3 % (ref 34.8–46.6)
HGB: 13.5 g/dL (ref 11.6–15.9)
LYMPH%: 33.2 % (ref 14.0–49.7)
MCH: 29.3 pg (ref 25.1–34.0)
MCHC: 32.7 g/dL (ref 31.5–36.0)
MCV: 89.5 fL (ref 79.5–101.0)
MONO#: 0.8 10*3/uL (ref 0.1–0.9)
MONO%: 8.6 % (ref 0.0–14.0)
NEUT#: 5.4 10*3/uL (ref 1.5–6.5)
NEUT%: 55.5 % (ref 38.4–76.8)
Platelets: 258 10*3/uL (ref 145–400)
RBC: 4.61 10*6/uL (ref 3.70–5.45)
RDW: 12.5 % (ref 11.2–14.5)
WBC: 9.7 10*3/uL (ref 3.9–10.3)
lymph#: 3.2 10*3/uL (ref 0.9–3.3)

## 2016-02-20 NOTE — Progress Notes (Signed)
OFFICE PROGRESS NOTE   February 20, 2016   Physicians: Deedra Ehrich, Knox Saliva ([facility]), Nicholas Lose, Verl Blalock (rheumatology, [place]), Leda Roys (PCP, [facility]), Suzan Garibaldi (urologic surgeon, [facility]), (J.Matthew McDonald gyn onc [facility] [place]), Cherie Ouch (GI, [place]), _ [facility], [place]) , Drema Halon , Daniel Nones  INTERVAL HISTORY:   Patient is seen, alone for visit at her request, previously seen at this office for Faslodex injections which were used for recurrent low grade endometrial stromal sarcoma from ~ 05-2013 thru ~ 12-2015, those done in [place] after 06-2015 due to insurance coverage.  She was seen in follow up at [facility] ~ 12-2015 with recommendation that Faslodex could be discontinued. She began letrozole after stopping Faslodex (note also on Femara 2010/ 2011).  She saw Dr Josephina Shih 01-06-16 and will see him again in Dec. She has seen gyn dermatologist in [place] area, reports biopsy of perineal area not diagnostic.  Dr Josephina Shih gave her recommendation for surgeon if needed for cystocoele.  Patient is now established with Dr Drema Halon of medical oncology in [place], and with Dr Daniel Nones (dermatology [place]), possibly also with survivorship clnic in [place]. 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 Outside records include MRI abdomen and pelvis  from 11-24-15, report "Fatty change liver, no other focal abnormality liver. Spleen, pancreas, kidneys otherwise unremarkable. No significantly enlarged retroperitoneal nodes, no evidence of metastatic disease. Post prior hysterectomy. No pelvic masses. No enlarged lymph nodes. Areas of scarring consistent with injection along buttock region bilaterally. No significant free fluid. No evidence of metastatic disease in  pelvis". No recent febrile illness. No respiratory complaints. No noted changes in breasts bilaterally.  Remainder of 10 npoint Review of Systems negative / unchanged.   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 She had TAH BSO and staging 12-03-2008 by gyn oncology at [facility] in [place], with finding of stage IB low grade endometrial stromal sarcoma. That pathology ([id number] from 12-03-2008) was reviewed at [facility] 01-31-2009, the [facility] report to be scanned into this EMR: low grade endometrial stromal sarcoma 9.5 cm, confined to uterine corpus, + LVSI, 0/19 nodes involved, benign bilateral ovaries and tubes, benign cervix; "per outside report, the tumor is diffusely and strongly positive for estrogen receptor and progesterone receptor".  She had severe hot flashes after surgery/ prior to beginning aromatase inhibitor. She was seen in consultation by Dr Carlton Adam began [place] 02-2009, which she tolerated poorly due to progressive joint aches, worse hot flashes and related insomnia; she DCd Femara in 12-2009 due to those symptoms. She tried Aromasin from 01-2010 thru 03-2010, also unable to tolerate "due to insomnia". She briefly resumed Femara at 1/2 prior dose. From 04-2012 thru 02-2013 she took raloxifene, prescribed by rheumatologist for low bone density. She has been followed with MRI scans (all at [facility]) and exams by Dr Josephina Shih now every 3 months.  Patient developed symptomatic vaginal vault prolapse, with robotically assisted laparoscopic vaginal vault suspension by Dr Suzan Garibaldi, urogynecology at [facility] in [place], on 04-22-2013. At time of surgery, she had "5 mm nodules of recurrent low grade endometrial stromal sarcoma" resected. (Operative  note and that path not  available in present records, however path was reviewed at [facility], consistent with low grade endometrial stromal sarcoma). She was seen for second opinion consultation at [facility] by Dr Chilton Greathouse on 05-22-13 (consult note copied from patient's records now and will be scanned into EMR). The consultation note suggests that all of this incidentally found recurrence was resected at time of the urogynecology procedure; recommendation was for monthly Faslodex. She had #3 Faslodex at [facility] 09-10-2013 and has continued monthly injections at [facility] since 10-08-2013. MRI AP 12-21-13 had no evidence of disease. MRI AP 06-09-14 likewise no evidence of disease. Faslodex was given at [facility] thru 06-2015, then reportedly continued in [place] until decision to DC ~ 12-2015.    Objective:  Vital signs in last 24 hours:  BP 166/92 mmHg  Pulse 74  Temp(Src) 98.2 F (36.8 C) (Oral)  Resp 18  Ht _0  (1.626 m)  Wt 146 lb 14.4 oz (66.633 kg)  BMI 25.20 kg/m2  SpO2 100%  Alert, oriented and appropriate. Ambulatory without assistance difficulty.  Alopecia  HEENT:PERRL, sclerae not icteric. Oral mucosa moist without lesions, posterior pharynx clear.  Neck supple. No JVD.  Lymphatics:no cervical,supraclavicular, axillary or inguinal adenopathy Resp: clear to auscultation bilaterally and normal percussion bilaterally Cardio: regular rate and rhythm. No gallop. GI: soft, nontender, not distended, no mass or organomegaly. Normally active bowel sounds. Musculoskeletal/ Extremities: without pitting edema, cords, tenderness Neuro: no peripheral neuropathy. Otherwise nonfocal Skin without rash, ecchymosis, petechiae Breasts bilaterally without dominant mass, skin or nipple findings of concern. Axillae benign  Lab Results:  Results for orders placed or performed in visit on 02/20/16  CBC with Differential  Result Value Ref Range   WBC 9.7 3.9 - 10.3 10e3/uL   NEUT# 5.4 1.5 - 6.5 10e3/uL   HGB 13.5 11.6  - 15.9 g/dL   HCT 41.3 34.8 - 46.6 %   Platelets 258 145 - 400 10e3/uL   MCV 89.5 79.5 - 101.0 fL   MCH 29.3 25.1 - 34.0 pg   MCHC 32.7 31.5 - 36.0 g/dL   RBC 4.61 3.70 - 5.45 10e6/uL   RDW 12.5 11.2 - 14.5 %   lymph# 3.2 0.9 - 3.3 10e3/uL   MONO# 0.8 0.1 - 0.9 10e3/uL   Eosinophils Absolute 0.2 0.0 - 0.5 10e3/uL   Basophils Absolute 0.1 0.0 - 0.1 10e3/uL   NEUT% 55.5 38.4 - 76.8 %   LYMPH% 33.2 14.0 - 49.7 %   MONO% 8.6 0.0 - 14.0 %   EOS% 1.9 0.0 - 7.0 %   BASO% 0.8 0.0 - 2.0 %     Studies/Results:  No results found. Outside MRI AP 11-24-15 conclusion as above, to be scanned into this EMR  Medications: I have reviewed the patient's current medications.  DISCUSSION Unfortunately I really do not have other recommendations for patient, other than she may need to be aware if swimming aggravates the vulvar symptoms.  I have encouraged her to try to keep continuity of care with the excellent physicians who are in her insurance network and following her now.   Assessment/Plan:  1.low grade endometrial stromal sarcoma: history as above, incidentally found recurrent at urogynecologic procedure 04-2013, on monthly Faslodex since ~ Nov 2014 per recommendation from [facility] thru ~ 12-2015. Now on letrozole per oncology in [place]. MRI AP 11-2015 no findings of concern for recurrent or progressive disease. She will keep follow up with Dr Josephina Shih in 07-2016 as scheduled.  2.history of intraductal papilloma left breast with atypia 1998 and  personal dense breast tissue, mother and sister with lobular breast cancers, another sister with high risk for breast cancer. Mammograms now also in [place]. 3.urologic symptoms with known cystocoele and previous bladder tuck 4.osteoporosis: followed and addressed by physicians in [facility] 5.deafness right ear since childhood  6.hx basal cell skin ca  7.multiple miscarriages  8.osteoarthritis  9. Arthralgias from aromatase inhibitors previously,  not complaining of this at least now. Note she was on Femara 2010/ 2011 for the gyn malignancy 10. 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 CC Drs Beaulah Dinning and Myles Lipps, MD   02/20/2016, 9:26 AM

## 2016-02-24 ENCOUNTER — Telehealth: Payer: Self-pay | Admitting: Oncology

## 2016-02-24 NOTE — Telephone Encounter (Signed)
Faxed Dr. Mariana Kaufman office note to Dr. Beaulah Dinning  [phone]

## 2016-04-24 ENCOUNTER — Telehealth: Payer: Self-pay

## 2016-04-24 NOTE — Telephone Encounter (Signed)
Patient contacted and updated with referral being placed to Dr Alta Corning ( ph: [phone] ) Address : [address] , [place] ,[place] [address], Contacted Dr Zigmund Daniel office spoke with Regino Schultze , consultation was scheduled for June 12, 2016 at 1 PM . Patient is to arrive 15 min prior to appointment to register. Patient is to bring proof of insurance, photo ID and co-payment if applicable .Patient not available , left a detailed message with Dr Erin Hearing information . Our contact information was provided as well if additional questions arise.

## 2016-06-12 ENCOUNTER — Telehealth: Payer: Self-pay | Admitting: Genetic Counselor

## 2016-06-12 ENCOUNTER — Encounter: Payer: Self-pay | Admitting: Genetic Counselor

## 2016-06-12 DIAGNOSIS — Z1379 Encounter for other screening for genetic and chromosomal anomalies: Secondary | ICD-10-CM | POA: Insufficient documentation

## 2016-06-12 NOTE — Telephone Encounter (Signed)
Discussed that genetic testing 2 years ago found an MSH2 VUS.  This VUS has been reclassified to likely benign.  Therefore, we do not feel that this change explains the cancer in her family and that her test is now considered to be completely negative.  The patient voiced understanding.

## 2016-08-03 ENCOUNTER — Telehealth: Payer: Self-pay | Admitting: Gynecologic Oncology

## 2016-08-03 ENCOUNTER — Ambulatory Visit: Payer: BLUE CROSS/BLUE SHIELD | Admitting: Gynecology

## 2016-08-03 NOTE — Telephone Encounter (Signed)
Attempted to contact the patient.  She is scheduled to see Dr. Fermin Schwab on Jan 8 and has not had her MRI performed at this time according to [facility].  Was calling patient to check on current status and to see what steps needed to be taken to get her prepared for her appt (scheduling MRI, etc.).  Unable to leave a message.  Will retry at a later time.

## 2016-08-07 ENCOUNTER — Telehealth: Payer: Self-pay | Admitting: Gynecologic Oncology

## 2016-08-07 NOTE — Telephone Encounter (Signed)
Patient stating she had her MRIs at [facility] and she will bring the copy of the scans.  Confirmed appt for Monday.  Advised to call for any needs or concerns.

## 2016-08-13 ENCOUNTER — Encounter: Payer: Self-pay | Admitting: Gynecology

## 2016-08-13 ENCOUNTER — Ambulatory Visit: Payer: BLUE CROSS/BLUE SHIELD | Attending: Gynecology | Admitting: Gynecology

## 2016-08-13 VITALS — BP 134/80 | HR 88 | Temp 98.8°F | Resp 16 | Ht 64.0 in | Wt 147.5 lb

## 2016-08-13 DIAGNOSIS — N763 Subacute and chronic vulvitis: Secondary | ICD-10-CM | POA: Diagnosis not present

## 2016-08-13 DIAGNOSIS — N8110 Cystocele, unspecified: Secondary | ICD-10-CM | POA: Diagnosis not present

## 2016-08-13 DIAGNOSIS — N8111 Cystocele, midline: Secondary | ICD-10-CM | POA: Diagnosis not present

## 2016-08-13 DIAGNOSIS — Z8542 Personal history of malignant neoplasm of other parts of uterus: Secondary | ICD-10-CM | POA: Insufficient documentation

## 2016-08-13 DIAGNOSIS — N762 Acute vulvitis: Secondary | ICD-10-CM | POA: Diagnosis not present

## 2016-08-13 DIAGNOSIS — Z79811 Long term (current) use of aromatase inhibitors: Secondary | ICD-10-CM | POA: Diagnosis not present

## 2016-08-13 DIAGNOSIS — Z803 Family history of malignant neoplasm of breast: Secondary | ICD-10-CM | POA: Diagnosis not present

## 2016-08-13 DIAGNOSIS — M810 Age-related osteoporosis without current pathological fracture: Secondary | ICD-10-CM | POA: Insufficient documentation

## 2016-08-13 DIAGNOSIS — C541 Malignant neoplasm of endometrium: Secondary | ICD-10-CM | POA: Insufficient documentation

## 2016-08-13 NOTE — Progress Notes (Signed)
Consult Note: Gyn-Onc   Karla Watts 61 y.o. female  Chief Complaint  Patient presents with  . Low Grade endometrial stromal sarcoma of uterus    Assessment : Recurrent low-grade endometrial stromal sarcoma. Clinically free of disease. Cystocele which is relatively asymptomatic. Vulvitis.  Plan: She'll continue taking letrozole. Regarding her ball Vitas, she is encouraged to discontinue wearing pads and apply Desitin or clobetasol as needed. Currently her cystocele is relatively asymptomatic. She will continue to be followed by her medical oncologist in [place]. She return to see me in 6 months.  Interval History: The patient returns today for continuing followup of low-grade endometrial stromal sarcoma. A recurrence was discovered in September 2014. Subsequently the patient's been on Faslodex 500 mg IM every 4 weeks. Cousin pain in her legs she was switched to her last MRI was in September 2017 showing no evidence of disease.   She did see Maryland Pink in consultation who prescribed a bladder medication which seems to have improved her bladder function. Repair the cystocele was not recommended. The patient's altered the timing of her medications and seems to be sleeping much better. She actually has no significant urinary incontinence.  She denies any other. abdominal or GI symptoms. Functional status is excellent.    She is up-to-date with mammograms and is scheduled for an MRI soon.. She notes that she's had some improvement in her osteoporosis based on bone scans.    HPI: Patient developed abdominal pain and an 8 cm intrauterine mass in March of 2010. This is thought to be a fibroid. Preoperative endometrial biopsy and Pap smear were normal. She underwent a total abdominal hysterectomy bilateral salpingo-oophorectomy and staging procedures by a gynecologic oncologist. Final pathology showed a low-grade endometrial stromal sarcoma. She received a short course of Femara but did not  tolerate it well therefore discontinued therapy. She was then followed until September 2014 with a recurrence was found in the pelvic peritoneum at the time of robotic assisted vaginal vault suspension. The patient sought second opinion consultation at [facility] with the sarcoma service. Multiple options were recommended for adjuvant treatment and subsequently the patient chose be treated with Faslodex.  In 2017 the patient switched to letrozole 2.5 mg daily.  Review of Systems:10 point review of systems is negative except as noted in interval history.   Vitals: Blood pressure 134/80, pulse 88, temperature 98.8 F (37.1 C), temperature source Oral, resp. rate 16, height 5\' 4"  (1.626 m), weight 147 lb 8 oz (66.9 kg), SpO2 100 %.  Physical Exam: General : The patient is a healthy woman in no acute distress.  HEENT: normocephalic, extraoccular movements normal; neck is supple without thyromegally  Lynphnodes: Supraclavicular and inguinal nodes not enlarged  Abdomen: Soft, non-tender, no ascites, no organomegally, no masses, no hernias  Pelvic:  EGBUS: Normal female  Vagina: Normal, no lesions , she does have a moderate cystocele extending to the introitus. The apex of the vagina is well supported.Marland Kitchen Urethra and Bladder: Normal, non-tender  Cervix: Surgically absent  Uterus: Surgically absent  Bi-manual examination: Non-tender; no adenxal masses or nodularity  Rectal: normal sphincter tone, no masses, no blood  Lower extremities: No edema or varicosities. Normal range of motion      Allergies  Allergen Reactions  . Ciprofloxacin Swelling  . Morphine And Related Nausea And Vomiting  . Penicillins Nausea Only  . Sulfur Hives    Past Medical History:  Diagnosis Date  . Allergy   . Osteoporosis   .  Uterine cancer ([place]) 2010   Endometrial Stromal Sarcoma    Past Surgical History:  Procedure Laterality Date  . ABDOMINAL HYSTERECTOMY      Current  Outpatient Prescriptions  Medication Sig Dispense Refill  . Calcium-Vitamin D-Vitamin K (VIACTIV) [id number] MG-UNT-MCG CHEW Chew 1 tablet by mouth daily.    . cetirizine (ZYRTEC) 10 MG tablet Take 10 mg by mouth daily as needed for allergies.    . Cholecalciferol (VITAMIN D3) 1000 UNITS CAPS Take 1 capsule by mouth daily.    Marland Kitchen letrozole (FEMARA) 2.5 MG tablet Take 2.5 mg by mouth daily.    . Multiple Vitamin (MULTIVITAMIN) capsule Take 1 capsule by mouth daily.    . polyethylene glycol (MIRALAX / GLYCOLAX) packet Take 17 g by mouth daily.     . zoledronic acid (RECLAST) 5 MG/100ML SOLN injection Inject 5 mg into the vein once. Pt received this medication on Feb.23,2016. Due annually.     No current facility-administered medications for this visit.     Social History   Social History  . Marital status: Married    Spouse name: N/A  . Number of children: N/A  . Years of education: N/A   Occupational History  . Not on file.   Social History Main Topics  . Smoking status: Never Smoker  . Smokeless tobacco: Never Used  . Alcohol use Yes     Comment: 1-2 per week  . Drug use: No  . Sexual activity: Not on file   Other Topics Concern  . Not on file   Social History Narrative  . No narrative on file    Family History  Problem Relation Age of Onset  . Breast cancer Mother 77    lobular breast cancer  . Lymphoma Father 35  . Breast cancer Sister 71    lobular breast cancer  . Stroke Maternal Grandfather   . Leukemia Paternal Grandmother     dx in her 6s  . Heart attack Paternal Grandfather       Marti Sleigh, MD 08/13/2016, 12:54 PM

## 2016-08-13 NOTE — Patient Instructions (Signed)
Follow up with Dr Fermin Schwab in six months. Need to call in April to schedule appointment for July/August.

## 2017-05-10 IMAGING — MR MR BREAST BILATERAL W WO CONTRAST
9 of 13 series · 32 of 48 positions shown · IV contrast (13 mlMultihance)
Comparison: Mammography dated 11/13/2010 through 02/03/2015.

CLINICAL DATA: Patient is an asymptomatic 59-year-old female with a
family history of breast cancer diagnosed in her mother at age 52
and sister at age 65. She reports a previous benign left lumpectomy
performed in 5999. She has a personal history of uterine cancer
status post hysterectomy.

LABS:  No labs today.
EXAM:
BILATERAL BREAST MRI WITH AND WITHOUT CONTRAST
TECHNIQUE: Multiplanar, multisequence MR images of both breasts were obtained
prior to and following the intravenous administration of 13 ml of
MultiHance.

[Series 2: T2 · axial · 3.0mm · 0.94mm/px · z∈[-63,+99]mm · 3 of 55 slices shown]
[im 1/55]
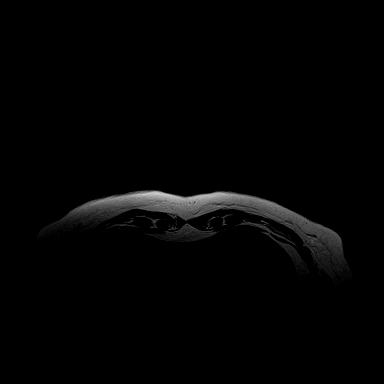
[im 28/55]
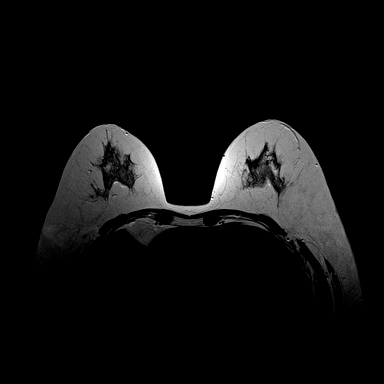
[im 55/55]
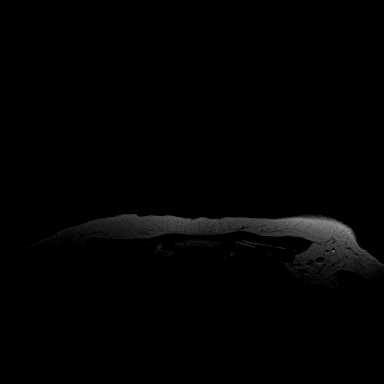

[Series 3: t2_tirm_tra ipat (a-p) · axial · 3.0mm · 0.70mm/px · z∈[-63,+99]mm · 2 of 55 slices shown]
[im 1/55]
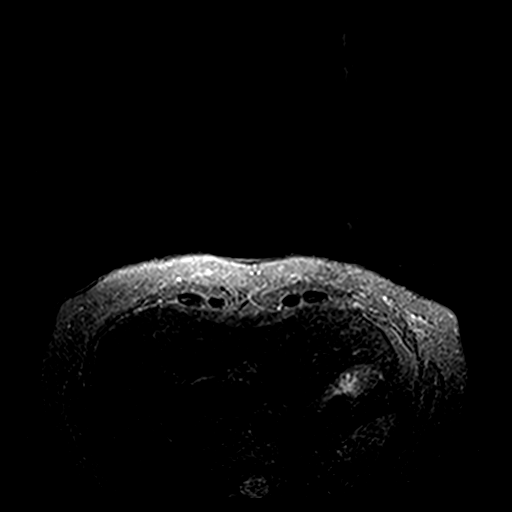
[im 55/55]
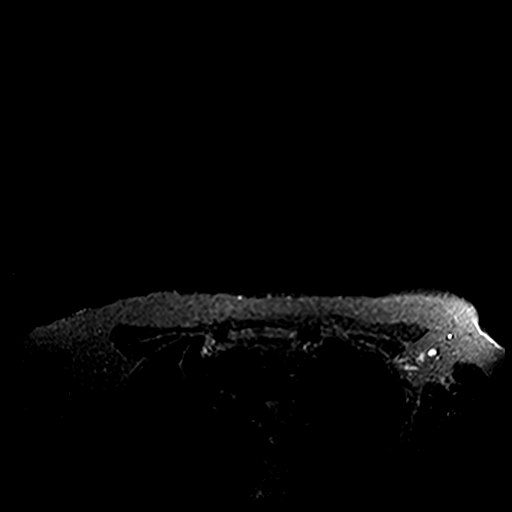

[Series 4: fl3d pre-cm no · axial · non-contrast · 1.2mm · 0.94mm/px · z∈[-68,+104]mm · 5 of 144 slices shown]
[im 1/144]
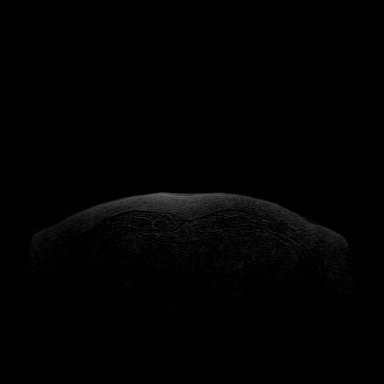
[im 36/144]
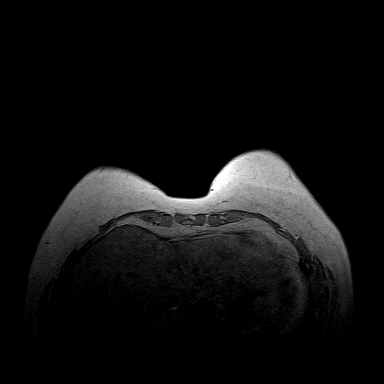
[im 72/144]
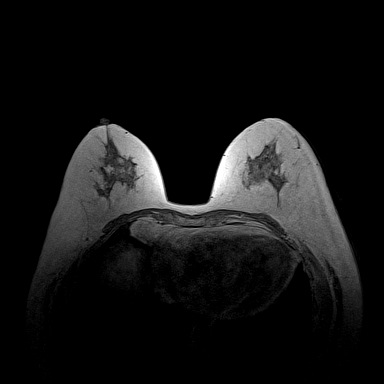
[im 108/144]
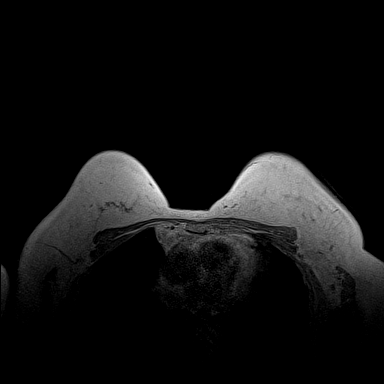
[im 144/144]
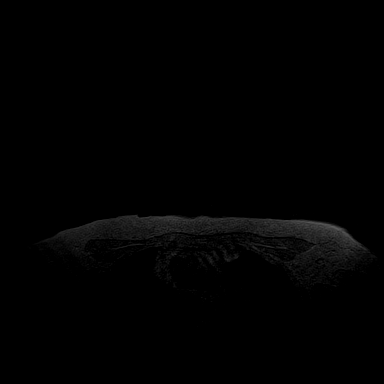

[Series 5: fl3d pre-cm · axial · non-contrast · 1.2mm · 0.94mm/px · z∈[-68,+104]mm · 5 of 144 slices shown]
[im 1/144]
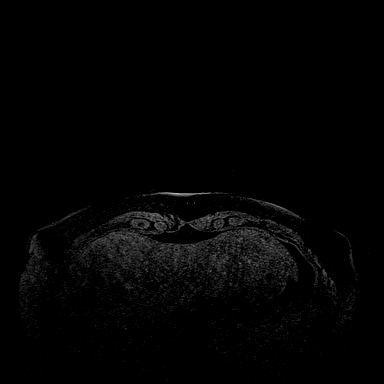
[im 36/144]
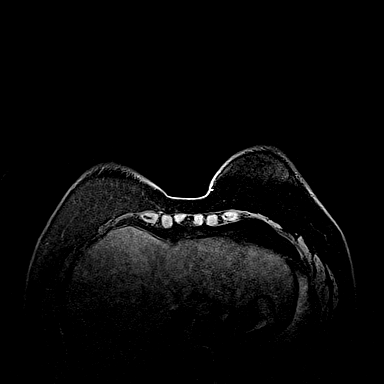
[im 72/144]
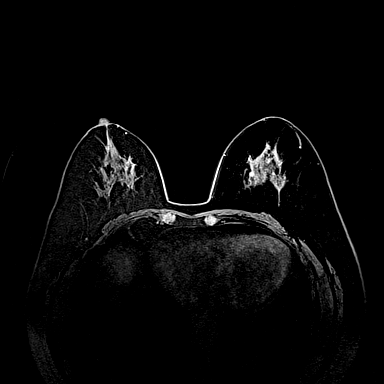
[im 108/144]
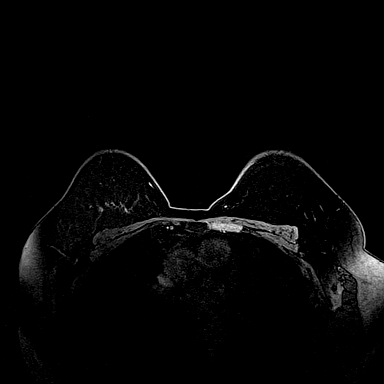
[im 144/144]
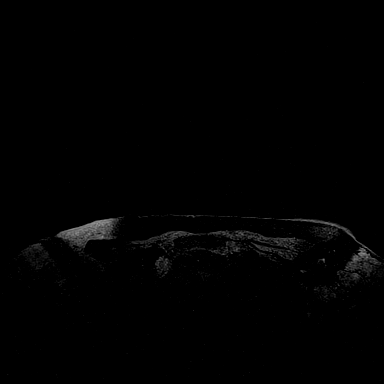

[Series 6: fl3d post-cm 20 · axial · 1.2mm · 0.94mm/px · z∈[-68,+104]mm · 5 of 144 slices shown (1 of 3)]
[im 1/144]
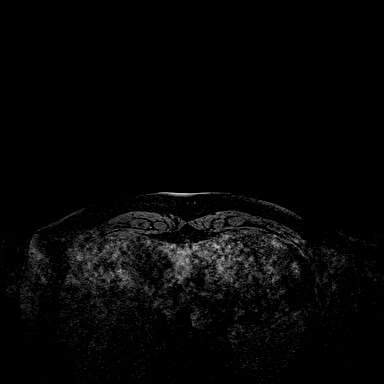
[im 36/144]
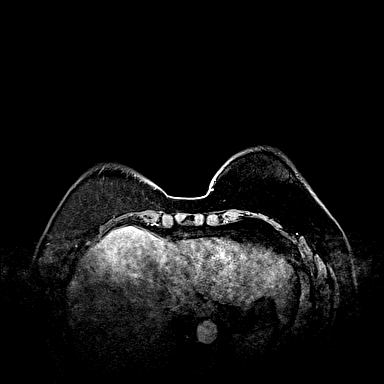
[im 72/144]
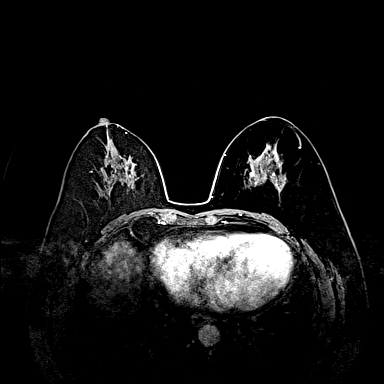
[im 108/144]
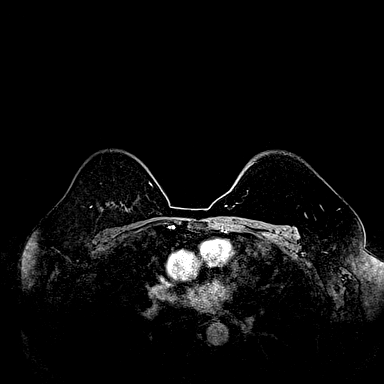
[im 144/144]
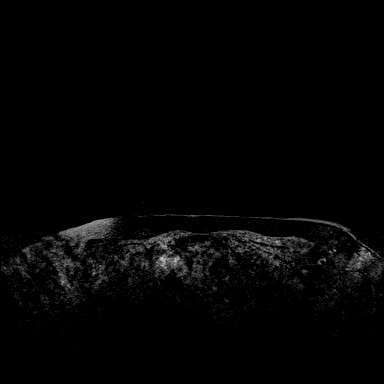

[Series 7: fl3d post-cm 20 · axial · 1.2mm · 0.94mm/px · z∈[-68,+104]mm · 5 of 144 slices shown (2 of 3)]
[im 1/144]
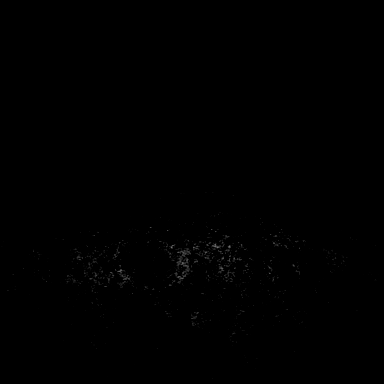
[im 36/144]
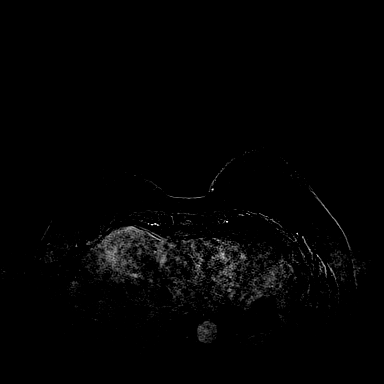
[im 72/144]
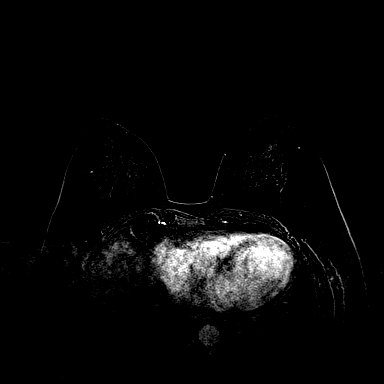
[im 108/144]
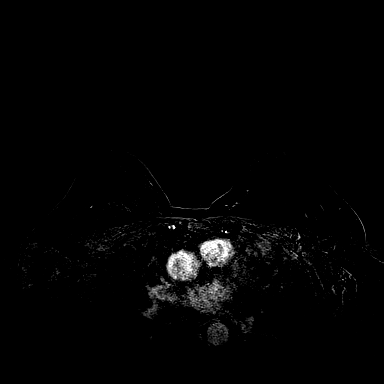
[im 144/144]
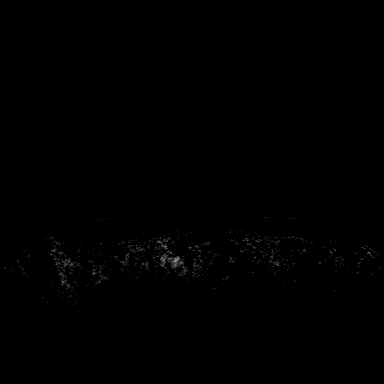

[Series 8: fl3d post-cm 20 · axial · 172.8mm · 0.94mm/px · 1 of 1 slices shown (3 of 3)]
[im 1/1]
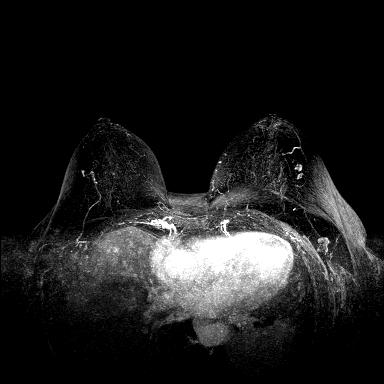

[Series 9: fl3d post-cm 3min · axial · 1.2mm · 0.94mm/px · z∈[-68,+104]mm · 5 of 144 slices shown]
[im 1/144]
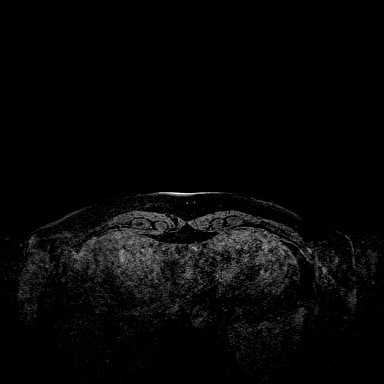
[im 36/144]
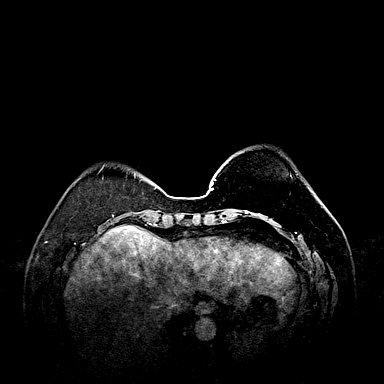
[im 72/144]
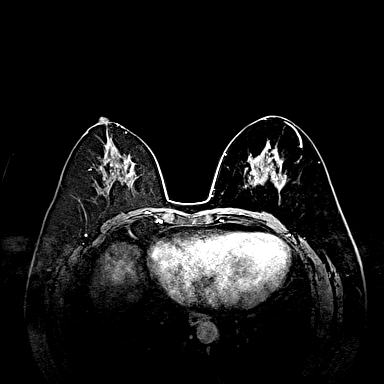
[im 108/144]
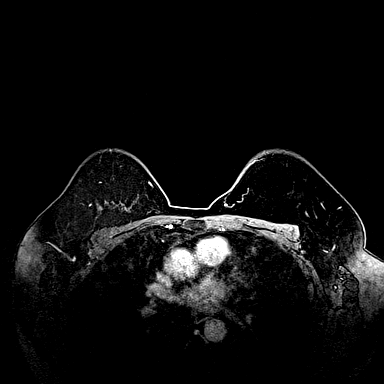
[im 144/144]
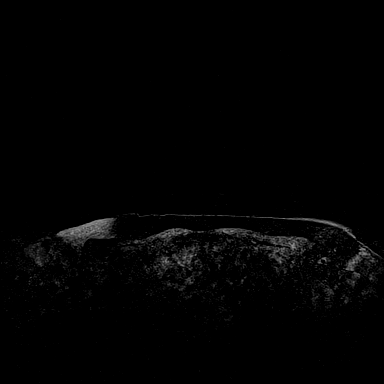

[Series 10: fl3d post-cm 3min_sub · axial · 1.2mm · 0.94mm/px · 1 of 144 slices shown]
[im 1/144]
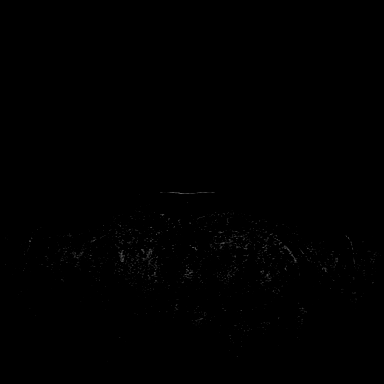

[32 of 48 positions shown; findings below may reference images not displayed]

THREE-DIMENSIONAL MR IMAGE RENDERING ON INDEPENDENT WORKSTATION:

Three-dimensional MR images were rendered by post-processing of the
original MR data on an independent workstation. The
three-dimensional MR images were interpreted, and findings are
reported in the following complete MRI report for this study. Three
dimensional images were evaluated at the independent [employer]
workstation
FINDINGS: Breast composition: Heterogeneous fibroglandular tissue.

Background parenchymal enhancement: There is mild symmetric
background parenchymal enhancement with scattered foci of non mass
enhancement bilaterally with benign MR features.

Right breast: No abnormal mass, contrast enhancement, or morphologic
abnormality to suggest malignancy. There is an intramammary lymph
node with benign MR features identified in the lateral breast.

Left breast: No abnormal mass, contrast enhancement, or morphologic
abnormality to suggest malignancy. There are two intramammary lymph
nodes in the upper outer breast with benign MR features. These have
been stable mammographically dating back 8768.

Lymph nodes: The axillary and internal mammary lymph node chains are
symmetric and unremarkable.

Ancillary findings:  None.
IMPRESSION: No MR findings to suggest primary breast malignancy.

RECOMMENDATION:
Screening mammogram in one year.(Code:65-7-MRF)

BI-RADS CATEGORY  2: Benign.

## 2017-05-15 ENCOUNTER — Telehealth: Payer: Self-pay | Admitting: *Deleted

## 2017-05-15 NOTE — Telephone Encounter (Signed)
Patient called and scheduled a follow up appt with Dr. Fermin Schwab for December 7th at 1:30pm

## 2017-07-12 ENCOUNTER — Telehealth: Payer: Self-pay | Admitting: *Deleted

## 2017-07-12 ENCOUNTER — Ambulatory Visit: Payer: BLUE CROSS/BLUE SHIELD | Admitting: Gynecology

## 2017-07-12 NOTE — Telephone Encounter (Signed)
Patient called and cancelled her appt for today. Appt moved to January 4th.

## 2017-07-19 ENCOUNTER — Other Ambulatory Visit: Payer: Self-pay

## 2017-07-19 MED ORDER — CLOBETASOL PROPIONATE 0.05 % EX OINT: 1.0000 "application " | TOPICAL_OINTMENT | Freq: Two times a day (BID) | CUTANEOUS | 1 refills | Status: AC

## 2017-08-09 ENCOUNTER — Ambulatory Visit: Payer: BLUE CROSS/BLUE SHIELD | Attending: Gynecology | Admitting: Gynecology

## 2017-08-09 ENCOUNTER — Encounter: Payer: Self-pay | Admitting: Gynecology

## 2017-08-09 VITALS — BP 143/83 | HR 86 | Temp 98.4°F | Resp 20 | Ht 66.0 in | Wt 149.0 lb

## 2017-08-09 DIAGNOSIS — Z79899 Other long term (current) drug therapy: Secondary | ICD-10-CM | POA: Diagnosis not present

## 2017-08-09 DIAGNOSIS — N8110 Cystocele, unspecified: Secondary | ICD-10-CM | POA: Insufficient documentation

## 2017-08-09 DIAGNOSIS — Z880 Allergy status to penicillin: Secondary | ICD-10-CM | POA: Diagnosis not present

## 2017-08-09 DIAGNOSIS — C541 Malignant neoplasm of endometrium: Secondary | ICD-10-CM | POA: Diagnosis not present

## 2017-08-09 DIAGNOSIS — R05 Cough: Secondary | ICD-10-CM | POA: Diagnosis not present

## 2017-08-09 NOTE — Patient Instructions (Signed)
Plan to follow up in one year or sooner if needed.  Please call our office at [phone] closer to the date to schedule.

## 2017-08-09 NOTE — Progress Notes (Signed)
Consult Note: Gyn-Onc   Karla Watts 62 y.o. female  Chief Complaint  Patient presents with  . 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 Cystocele which is relatively asymptomatic  Plan: She'll continue taking letrozole.   Currently her cystocele is relatively asymptomatic. She will continue to be followed by her medical oncologist in [place] who will schedule a repeat MRI in August 2019.Marland Kitchen She return to see me in 12 months.  Interval History: The patient returns today for continuing followup of low-grade endometrial stromal sarcoma.  She is currently taking letrozole without any difficulty. She had an MRI in August showing no evidence of disease.  For the last 4 months she has had a cough and been on 2 different rounds of antibiotics.  She has had chest x-rays showing no evidence of metastatic disease or pneumonia..  She denies any other. abdominal or GI symptoms. Functional status is excellent.    She is up-to-date with mammograms.       HPI: Patient developed abdominal pain and an 8 cm intrauterine mass in March of 2010. This is thought to be a fibroid. Preoperative endometrial biopsy and Pap smear were normal. She underwent a total abdominal hysterectomy bilateral salpingo-oophorectomy and staging procedures by a gynecologic oncologist. Final pathology showed a low-grade endometrial stromal sarcoma. She received a short course of Femara but did not tolerate it well therefore discontinued therapy. She was then followed until September 2014 with a recurrence was found in the pelvic peritoneum at the time of robotic assisted vaginal vault suspension. The patient sought second opinion consultation at [facility] with the sarcoma service. Multiple options were recommended for adjuvant treatment and subsequently the patient chose be treated with Faslodex.  In  2017 the patient switched to letrozole 2.5 mg daily.  Review of Systems:10 point review of systems is negative except as noted in interval history.   Vitals: Blood pressure (!) 143/83, pulse 86, temperature 98.4 F (36.9 C), temperature source Oral, resp. rate 20, height 5\' 6"  (1.676 m), weight 149 lb (67.6 kg), SpO2 97 %.  Physical Exam: General : The patient is a healthy woman in no acute distress.  HEENT: normocephalic, extraoccular movements normal; neck is supple without thyromegally  Lynphnodes: Supraclavicular and inguinal nodes not enlarged  Abdomen: Soft, non-tender, no ascites, no organomegally, no masses, no hernias  Pelvic:  EGBUS: Normal female  Vagina: Normal, no lesions , she does have a moderate cystocele extending to the introitus. The apex of the vagina is well supported.Marland Kitchen Urethra and Bladder: Normal, non-tender  Cervix: Surgically absent  Uterus: Surgically absent  Bi-manual examination: Non-tender; no adenxal masses or nodularity  Rectal: normal sphincter tone, no masses, no blood  Lower extremities: No edema or varicosities. Normal range of motion      Allergies  Allergen Reactions  . Ciprofloxacin Swelling  . Morphine And Related Nausea And Vomiting  . Penicillins Nausea Only  . Sulfur Hives    Past Medical History:  Diagnosis Date  . Allergy   . Osteoporosis   . Uterine cancer ([place]) 2010   Endometrial Stromal Sarcoma    Past Surgical History:  Procedure Laterality Date  . ABDOMINAL HYSTERECTOMY      Current Outpatient Medications  Medication Sig Dispense Refill  . Calcium-Vitamin D-Vitamin K (VIACTIV) [id number] MG-UNT-MCG CHEW Chew 1 tablet by mouth daily.    . cetirizine (ZYRTEC) 10 MG tablet Take  10 mg by mouth daily as needed for allergies.    . Cholecalciferol (VITAMIN D3) 1000 UNITS CAPS Take 1 capsule by mouth daily.    . clobetasol ointment (TEMOVATE) 9.17 % Apply 1 application topically 2 (two) times daily. 30 g 1  . letrozole  (FEMARA) 2.5 MG tablet Take 2.5 mg by mouth daily.    . Multiple Vitamin (MULTIVITAMIN) capsule Take 1 capsule by mouth daily.    . polyethylene glycol (MIRALAX / GLYCOLAX) packet Take 17 g by mouth daily.     . zoledronic acid (RECLAST) 5 MG/100ML SOLN injection Inject 5 mg into the vein once. Pt received this medication on Feb.23,2016. Due annually.     No current facility-administered medications for this visit.     Social History   Socioeconomic History  . Marital status: Married    Spouse name: Not on file  . Number of children: Not on file  . Years of education: Not on file  . Highest education level: Not on file  Social Needs  . Financial resource strain: Not on file  . Food insecurity - worry: Not on file  . Food insecurity - inability: Not on file  . Transportation needs - medical: Not on file  . Transportation needs - non-medical: Not on file  Occupational History  . Not on file  Tobacco Use  . Smoking status: Never Smoker  . Smokeless tobacco: Never Used  Substance and Sexual Activity  . Alcohol use: Yes    Comment: 1-2 per week  . Drug use: No  . Sexual activity: Not on file  Other Topics Concern  . Not on file  Social History Narrative  . Not on file    Family History  Problem Relation Age of Onset  . Breast cancer Mother 25       lobular breast cancer  . Lymphoma Father 9  . Breast cancer Sister 82       lobular breast cancer  . Stroke Maternal Grandfather   . Leukemia Paternal Grandmother        dx in her 25s  . Heart attack Paternal Grandfather       Marti Sleigh, MD 08/09/2017, 11:08 AM

## 2018-06-06 ENCOUNTER — Telehealth: Payer: Self-pay | Admitting: *Deleted

## 2018-06-06 NOTE — Telephone Encounter (Signed)
Returned the patients call and scheduled the follow up appt for January

## 2018-09-02 ENCOUNTER — Encounter: Payer: Self-pay | Admitting: Gynecology

## 2018-09-02 ENCOUNTER — Inpatient Hospital Stay: Payer: BLUE CROSS/BLUE SHIELD | Attending: Gynecology | Admitting: Gynecology

## 2018-09-02 VITALS — BP 127/82 | HR 70 | Temp 97.6°F | Resp 18 | Ht 66.0 in | Wt 148.4 lb

## 2018-09-02 DIAGNOSIS — Z90710 Acquired absence of both cervix and uterus: Secondary | ICD-10-CM | POA: Diagnosis not present

## 2018-09-02 DIAGNOSIS — N8111 Cystocele, midline: Secondary | ICD-10-CM | POA: Diagnosis not present

## 2018-09-02 DIAGNOSIS — C541 Malignant neoplasm of endometrium: Secondary | ICD-10-CM | POA: Diagnosis not present

## 2018-09-02 DIAGNOSIS — Z90722 Acquired absence of ovaries, bilateral: Secondary | ICD-10-CM | POA: Diagnosis not present

## 2018-09-02 NOTE — Progress Notes (Signed)
Consult Note: Gyn-Onc   Karla Watts 63 y.o. female  Chief Complaint  Patient presents with  . 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 Cystocele which is relatively asymptomatic  Plan:  . Currently her cystocele is relatively asymptomatic. She will continue to be followed by her medical oncologist in [place] who will schedule a repeat MRI in the summer.. She return to see me in 12 months.  Interval History: The patient returns today for continuing followup of low-grade endometrial stromal sarcoma.   At last visit the patient was taking letrozole and Faslodex.  Subsequently she is discontinue those medications because she was fatigued and tired.  Is also noted that she is recently started taking Cymbalta.  She saw Dr. [place] Pink regarding her cystocele and conservative management was recommended.  She recently found a lump in her breast but apparently the MRI evaluation showed this to be benign.  She also had an MRI of the pelvis this past summer that showed no evidence of recurrent endometrial stromal sarcoma.  She denies any other GI, GU, or pelvic symptoms. Functional status is excellent.    She is up-to-date with mammograms.       HPI: Patient developed abdominal pain and an 8 cm intrauterine mass in March of 2010. This is thought to be a fibroid. Preoperative endometrial biopsy and Pap smear were normal. She underwent a total abdominal hysterectomy bilateral salpingo-oophorectomy and staging procedures by a gynecologic oncologist. Final pathology showed a low-grade endometrial stromal sarcoma. She received a short course of Femara but did not tolerate it well therefore discontinued therapy. She was then followed until September 2014 with a recurrence was found in the pelvic peritoneum at the time of robotic assisted vaginal vault suspension. The patient sought second opinion  consultation at [facility] with the sarcoma service. Multiple options were recommended for adjuvant treatment and subsequently the patient chose be treated with Faslodex.  In 2017 the patient switched to letrozole 2.5 mg daily.  She took letrozole and Faslodex for approximately 5 years and then discontinued both.  Review of Systems:10 point review of systems is negative except as noted in interval history.   Vitals: Blood pressure 127/82, pulse 70, temperature 97.6 F (36.4 C), temperature source Oral, resp. rate 18, height 5\' 6"  (1.676 m), weight 148 lb 7 oz (67.3 kg), SpO2 97 %.  Physical Exam: General : The patient is a healthy woman in no acute distress.  HEENT: normocephalic, extraoccular movements normal; neck is supple without thyromegally  Lynphnodes: Supraclavicular and inguinal nodes not enlarged  Abdomen: Soft, non-tender, no ascites, no organomegally, no masses, no hernias  Pelvic:  EGBUS: Normal female  Vagina: Normal, no lesions , she does have a moderate cystocele extending to the introitus. The apex of the vagina is well supported.Marland Kitchen Urethra and Bladder: Normal, non-tender  Cervix: Surgically absent  Uterus: Surgically absent  Bi-manual examination: Non-tender; no adenxal masses or nodularity  Rectal: normal sphincter tone, no masses, no blood  Lower extremities: No edema or varicosities. Normal range of motion      Allergies  Allergen Reactions  . Ciprofloxacin Swelling  . Morphine And Related Nausea And Vomiting  . Penicillins Nausea Only  . Sulfur Hives    Past Medical History:  Diagnosis Date  . Allergy   . Osteoporosis   . Uterine cancer ([place]) 2010   Endometrial Stromal Sarcoma  Past Surgical History:  Procedure Laterality Date  . ABDOMINAL HYSTERECTOMY      Current Outpatient Medications  Medication Sig Dispense Refill  . Calcium-Vitamin D-Vitamin K (VIACTIV) [id number] MG-UNT-MCG CHEW Chew 1 tablet by mouth daily.     . cetirizine (ZYRTEC) 10 MG tablet Take 10 mg by mouth daily as needed for allergies.    . Cholecalciferol (VITAMIN D3) 1000 UNITS CAPS Take 1 capsule by mouth daily.    . clobetasol ointment (TEMOVATE) 4.17 % Apply 1 application topically 2 (two) times daily. 30 g 1  . doxycycline (VIBRA-TABS) 100 MG tablet     . DULoxetine (CYMBALTA) 30 MG capsule Take 30 mg by mouth daily.    . Multiple Vitamin (MULTIVITAMIN) capsule Take 1 capsule by mouth daily.    Marland Kitchen OMEPRAZOLE PO every morning.    . polyethylene glycol (MIRALAX / GLYCOLAX) packet Take 17 g by mouth daily.     . zoledronic acid (RECLAST) 5 MG/100ML SOLN injection Inject 5 mg into the vein once. Pt received this medication on Feb.23,2016. Due annually.     No current facility-administered medications for this visit.     Social History   Socioeconomic History  . Marital status: Married    Spouse name: Not on file  . Number of children: Not on file  . Years of education: Not on file  . Highest education level: Not on file  Occupational History  . Not on file  Social Needs  . Financial resource strain: Not on file  . Food insecurity:    Worry: Not on file    Inability: Not on file  . Transportation needs:    Medical: Not on file    Non-medical: Not on file  Tobacco Use  . Smoking status: Never Smoker  . Smokeless tobacco: Never Used  Substance and Sexual Activity  . Alcohol use: Yes    Comment: 1-2 per week  . Drug use: No  . Sexual activity: Not on file  Lifestyle  . Physical activity:    Days per week: Not on file    Minutes per session: Not on file  . Stress: Not on file  Relationships  . Social connections:    Talks on phone: Not on file    Gets together: Not on file    Attends religious service: Not on file    Active member of club or organization: Not on file    Attends meetings of clubs or organizations: Not on file    Relationship status: Not on file  . Intimate partner violence:    Fear of current or  ex partner: Not on file    Emotionally abused: Not on file    Physically abused: Not on file    Forced sexual activity: Not on file  Other Topics Concern  . Not on file  Social History Narrative  . Not on file    Family History  Problem Relation Age of Onset  . Breast cancer Mother 26       lobular breast cancer  . Lymphoma Father 73  . Breast cancer Sister 11       lobular breast cancer  . Stroke Maternal Grandfather   . Leukemia Paternal Grandmother        dx in her 24s  . Heart attack Paternal Grandfather       Marti Sleigh, MD 09/02/2018, 10:49 AM

## 2018-09-02 NOTE — Patient Instructions (Addendum)
Plan to follow up with your Medical Oncologist in six months with an MRI and Dr. Fermin Schwab in one year.  Please call our office at [phone] after you see you Medical Oncologist in six months to schedule your appointment in one year.   Medical Oncologist is Dr. Waynette Buttery. Office number is [phone]. Obtain MRI from ~ July 2020 and office note for Dr. Fermin Schwab' visit in 08-2019
